# Patient Record
Sex: Female | Born: 1970
Health system: Southern US, Community
[De-identification: ages and names within clinical notes are randomized; demographics above are authoritative.]

## PROBLEM LIST (undated history)

## (undated) DIAGNOSIS — Z8489 Family history of other specified conditions: Secondary | ICD-10-CM

## (undated) DIAGNOSIS — R3129 Other microscopic hematuria: Secondary | ICD-10-CM

## (undated) DIAGNOSIS — N12 Tubulo-interstitial nephritis, not specified as acute or chronic: Secondary | ICD-10-CM

## (undated) DIAGNOSIS — R011 Cardiac murmur, unspecified: Secondary | ICD-10-CM

## (undated) DIAGNOSIS — N83209 Unspecified ovarian cyst, unspecified side: Secondary | ICD-10-CM

## (undated) DIAGNOSIS — T4145XA Adverse effect of unspecified anesthetic, initial encounter: Secondary | ICD-10-CM

## (undated) DIAGNOSIS — D649 Anemia, unspecified: Secondary | ICD-10-CM

## (undated) HISTORY — DX: Other microscopic hematuria: R31.29

## (undated) HISTORY — PX: WISDOM TOOTH EXTRACTION: SHX21

## (undated) HISTORY — PX: HERNIA REPAIR: SHX51

## (undated) HISTORY — DX: Unspecified ovarian cyst, unspecified side: N83.209

---

## 1996-01-14 HISTORY — PX: TUBAL LIGATION: SHX77

## 1997-11-26 ENCOUNTER — Emergency Department (HOSPITAL_COMMUNITY): Admission: EM | Admit: 1997-11-26 | Discharge: 1997-11-26 | Payer: Self-pay | Admitting: Emergency Medicine

## 1998-08-17 ENCOUNTER — Emergency Department (HOSPITAL_COMMUNITY): Admission: EM | Admit: 1998-08-17 | Discharge: 1998-08-17 | Payer: Self-pay | Admitting: Emergency Medicine

## 1998-09-07 ENCOUNTER — Other Ambulatory Visit: Admission: RE | Admit: 1998-09-07 | Discharge: 1998-09-07 | Payer: Self-pay | Admitting: *Deleted

## 1998-12-28 ENCOUNTER — Emergency Department (HOSPITAL_COMMUNITY): Admission: EM | Admit: 1998-12-28 | Discharge: 1998-12-28 | Payer: Self-pay | Admitting: Emergency Medicine

## 1999-04-28 ENCOUNTER — Encounter: Payer: Self-pay | Admitting: Emergency Medicine

## 1999-04-28 ENCOUNTER — Emergency Department (HOSPITAL_COMMUNITY): Admission: EM | Admit: 1999-04-28 | Discharge: 1999-04-28 | Payer: Self-pay | Admitting: Emergency Medicine

## 1999-07-25 ENCOUNTER — Encounter: Payer: Self-pay | Admitting: Orthopedic Surgery

## 1999-07-25 ENCOUNTER — Ambulatory Visit (HOSPITAL_COMMUNITY): Admission: RE | Admit: 1999-07-25 | Discharge: 1999-07-25 | Payer: Self-pay | Admitting: Orthopedic Surgery

## 2000-03-21 ENCOUNTER — Encounter: Admission: RE | Admit: 2000-03-21 | Discharge: 2000-06-19 | Payer: Self-pay | Admitting: Anesthesiology

## 2000-09-26 ENCOUNTER — Encounter: Admission: RE | Admit: 2000-09-26 | Discharge: 2000-12-25 | Payer: Self-pay | Admitting: Anesthesiology

## 2001-01-02 ENCOUNTER — Encounter: Payer: Self-pay | Admitting: Orthopedic Surgery

## 2001-01-02 ENCOUNTER — Encounter: Admission: RE | Admit: 2001-01-02 | Discharge: 2001-01-02 | Payer: Self-pay | Admitting: Orthopedic Surgery

## 2002-08-03 ENCOUNTER — Emergency Department (HOSPITAL_COMMUNITY): Admission: EM | Admit: 2002-08-03 | Discharge: 2002-08-03 | Payer: Self-pay | Admitting: Emergency Medicine

## 2002-08-03 ENCOUNTER — Encounter: Payer: Self-pay | Admitting: Emergency Medicine

## 2003-07-25 ENCOUNTER — Other Ambulatory Visit: Admission: RE | Admit: 2003-07-25 | Discharge: 2003-07-25 | Payer: Self-pay | Admitting: Obstetrics and Gynecology

## 2003-07-26 ENCOUNTER — Ambulatory Visit (HOSPITAL_COMMUNITY): Admission: RE | Admit: 2003-07-26 | Discharge: 2003-07-26 | Payer: Self-pay | Admitting: Obstetrics and Gynecology

## 2006-03-30 ENCOUNTER — Emergency Department (HOSPITAL_COMMUNITY): Admission: EM | Admit: 2006-03-30 | Discharge: 2006-03-30 | Payer: Self-pay | Admitting: Emergency Medicine

## 2006-12-28 ENCOUNTER — Inpatient Hospital Stay (HOSPITAL_COMMUNITY): Admission: AD | Admit: 2006-12-28 | Discharge: 2006-12-29 | Payer: Self-pay | Admitting: Family Medicine

## 2006-12-28 ENCOUNTER — Ambulatory Visit: Payer: Self-pay | Admitting: Family Medicine

## 2008-04-15 ENCOUNTER — Emergency Department: Payer: Self-pay | Admitting: Emergency Medicine

## 2008-10-08 ENCOUNTER — Emergency Department (HOSPITAL_COMMUNITY): Admission: EM | Admit: 2008-10-08 | Discharge: 2008-10-08 | Payer: Self-pay | Admitting: Emergency Medicine

## 2010-10-12 NOTE — Procedures (Signed)
[facility]  Patient:    Madison Adams, Madison Adams                        MRN: [id number] Proc. Date: 09/29/00 Adm. Date:  [id number] Attending:  Thyra Breed CC:         Harvie Junior, M.D.   Procedure Report  PROCEDURE:  Lumbar epidural steroid injection.  DIAGNOSIS:  Sciatica into the right lower extremity.  INTERVAL HISTORY:  The patient noted about a months worth of benefits from her last treatment with epidural steroid injections.  She got two shots at that time.  She since has noted insensification of her symptoms and pain even with sitting for brief periods of time with any pressure over her right buttock.  She has numbness that goes down into the right instep.  Associated with this, she has numbness and tingling up into the leg and pain.  It is affecting her sleep patterns as well as feeling very moody.  She wears a brace at work.  She has been on Ultram and methocarbamol, which she notes that the former seems to help her; the latter she in uncertain about.  PHYSICAL EXAMINATION:  The patient is afebrile with vital signs stable.  The patient noted increased pain with hyperextension of her back and palpation on the spinous processes of the lower lumbar spine.  Forward flexion did not increase her pain.  She had negative straight leg raise signs.  Deep tendon reflexes were symmetric at the knees and ankles.  Motor was difficult to test, as this seemed to increase her pain fairly markedly.  DESCRIPTION OF PROCEDURE:  After informed consent was obtained, the patient was placed in a sitting position and monitored.  Her back was prepped with Betadine x 3.  A skin wheal was raised at the L4-L5 interspace with 1% lidocaine.  A 20 gauge Tuohy needle was introduced to the lumbar epidural space to loss of resistance to preservative-free normal saline.  The depth was 4 cm.   There was no CSF nor blood.  Medrol 80 mg in 8 mL of preservative-free normal saline  was gently injected.  The needle was flushed with preservative-free normal saline and removed intact.  Postprocedure condition - stable.  DISCHARGE INSTRUCTIONS: 1. Resume previous diet. 2. Limitations on activities per instruction sheet. 3. Continue on current medications. 4. Follow up with me in one week for a repeat injection. DD:  09/29/00 TD:  09/27/00 Job: [id number]

## 2010-10-12 NOTE — Op Note (Signed)
[facility]  Patient:    Madison Adams, Madison Adams                        MRN: [id number] Proc. Date: 03/31/00 Adm. Date:  [id number] Attending:  Thyra Breed CC:         Harvie Junior, M.D.   Operative Report  PROCEDURE:  Lumbar epidural steroid injection.  DIAGNOSIS:  Low back pain with sciatic into the right lower extremity.  ANESTHESIOLOGIST:  Thyra Breed, M.D.  INTERVAL HISTORY:  The patient noted overall improvement after her first injection.  She developed a headache, but this responded to vigorous fluid intake.  PHYSICAL EXAMINATION:  VITAL SIGNS:  Blood pressure 124/81, heart rate 82, respiratory rate 12, O2 saturation 98%, plain level is 2/10, and temperature 98.7.  BACK:  Shows good healing from previous injection site.  HEENT:  She has no headache today.  NEUROLOGIC:  Grossly unchanged.  PROCEDURE:  After informed consent was obtained, the patient was placed in the sitting position and monitored.  Her back was prepped with Betadine x 3.  A skin wheal was raised at the L4-5 inner space with 1% lidocaine.  A 20-gauge Tuohy needle was introduced to the lumbar epidural space to loss of resistance to preservative free normal saline.  There was no CSF nor blood.  Then 80 mg of Medrol and 10 ml preservative free normal saline was gently injected.  The needle was flushed with preservative free normal saline and removed intact.  POSTPROCEDURE CONDITION:  Stable.  DISCHARGE INSTRUCTIONS: 1. Resume previous diet. 2. Limitation of activities per instruction sheet. 3. Continue on current medications. 4. Follow up in one week is she continues to have discomfort.  If she has a    good response to this injection, we will hold off any further injections. DD:  03/31/00 TD:  03/31/00 Job: [id number]
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
## 2010-10-12 NOTE — Procedures (Signed)
[facility]  Patient:    Madison Adams, Madison Adams                        MRN: [id number] Proc. Date: 10/13/00 Adm. Date:  [id number] Attending:  Thyra Breed CC:         Harvie Junior, M.D.   Procedure Report  PROCEDURE:  Lumbar epidural steroid injection.  DIAGNOSIS:  Sciatica into the right lower extremity.  INTERVAL HISTORY:  The patients noted some overall improvement after the injection and presents for her second injection today. She had minimal side effects from the first injection.  PHYSICAL EXAMINATION:  Blood pressure 121/70, heart rates 82, respiratory rate 18, O2 saturations 100%, pain level is 4/10. She demonstrates negative straight leg raise signs with a good healing of her back.  DESCRIPTION OF PROCEDURE:  After informed consent was obtained, the patient was placed in the sitting position and monitored. The patients back was prepped with Betadine x 3. A skin wheal was raised at the L4-5 interspace with 1 percent lidocaine. A 20 gauge Tuohy needle was introduced to the lumbar epidural space to loss of resistance to preservative free normal saline. There was no cerebrospinal fluid nor blood. The depth was 4 cm. 80 mg of Medrol and 8 ml of preservative free normal saline was gently injected. The needle was flushed with preservative free normal saline and removed intact.  CONDITION POST PROCEDURE:  Stable.  DISCHARGE INSTRUCTIONS:  Resume previous diet. Limitations in activities per instruction sheet. Continue on current medications. Follow-up with me in 1-2 weeks for repeat injection. DD:  10/13/00 TD:  10/13/00 Job: [id number]

## 2010-10-12 NOTE — Procedures (Signed)
[facility]  Patient:    Madison Adams, Madison Adams                        MRN: [id number] Proc. Date: 10/20/00 Adm. Date:  [id number] Attending:  Thyra Breed CC:         Harvie Junior, M.D.   Procedure Report  PROCEDURE:  Lumbar epidural steroid injection.  DIAGNOSIS:  Sciatica into the right lower extremity.  INTERVAL HISTORY:  The patient has noted marked improvement after her first two injections.  She is here for her third today.  She has had minimal side effects to the medications.  PHYSICAL EXAMINATION:  Blood pressure 112/67, heart rate 88, respiratory rate 18, O2 saturations 99%.  Pain level is 1/10.  Her back shows good healing from previous injection site.  DESCRIPTION OF PROCEDURE:  After informed consent was obtained, the patient was placed in a sitting position and monitored.  Her back was prepped with Betadine x 3.  A skin wheal was raised at the L4-L5 interspace with 1% lidocaine.  A 20 gauge Tuohy needle was introduced in the lumbar epidural space to loss of resistance to preservative-free normal saline.  There was no CSF nor blood.  The depth was 4 cm.  I injected 80 mg of Medrol in 8 mL of preservative-free normal saline.  The needle was flushed with preservative-free normal saline and removed intact.  Postprocedure condition - stable.  DISCHARGE INSTRUCTIONS: 1. Resume previous diet. 2. Limitations on activities per instruction sheet. 3. Continue on current medications. 4. The patient plans to follow up with Dr. Luiz Blare. DD:  10/20/00 TD:  10/20/00 Job: [id number]

## 2011-01-14 ENCOUNTER — Emergency Department: Payer: Self-pay | Admitting: Emergency Medicine

## 2011-03-11 LAB — HCG, SERUM, QUALITATIVE

## 2011-06-19 ENCOUNTER — Emergency Department: Payer: Self-pay | Admitting: Emergency Medicine

## 2011-06-19 LAB — URINALYSIS, COMPLETE
Bilirubin,UR: NEGATIVE
Glucose,UR: NEGATIVE mg/dL (ref 0–75)
Ketone: NEGATIVE
Nitrite: NEGATIVE
Specific Gravity: 1.001 (ref 1.003–1.030)
Squamous Epithelial: 1
WBC UR: <1 /HPF (ref 0–5)

## 2012-06-28 ENCOUNTER — Emergency Department: Payer: Self-pay | Admitting: Emergency Medicine

## 2013-05-27 DIAGNOSIS — T8859XA Other complications of anesthesia, initial encounter: Secondary | ICD-10-CM

## 2013-05-27 HISTORY — DX: Other complications of anesthesia, initial encounter: T88.59XA

## 2013-08-20 ENCOUNTER — Ambulatory Visit: Payer: Self-pay | Admitting: Nurse Practitioner

## 2013-08-23 ENCOUNTER — Encounter: Payer: Self-pay | Admitting: *Deleted

## 2013-09-09 ENCOUNTER — Ambulatory Visit (INDEPENDENT_AMBULATORY_CARE_PROVIDER_SITE_OTHER): Payer: 59 | Admitting: General Surgery

## 2013-09-09 ENCOUNTER — Encounter: Payer: Self-pay | Admitting: General Surgery

## 2013-09-09 ENCOUNTER — Other Ambulatory Visit: Payer: 59

## 2013-09-09 VITALS — BP 98/60 | HR 72 | Resp 12 | Ht 62.0 in | Wt 144.0 lb

## 2013-09-09 DIAGNOSIS — N63 Unspecified lump in unspecified breast: Secondary | ICD-10-CM

## 2013-09-09 DIAGNOSIS — N6019 Diffuse cystic mastopathy of unspecified breast: Secondary | ICD-10-CM

## 2013-09-09 DIAGNOSIS — N6011 Diffuse cystic mastopathy of right breast: Secondary | ICD-10-CM

## 2013-09-09 DIAGNOSIS — N6012 Diffuse cystic mastopathy of left breast: Secondary | ICD-10-CM

## 2013-09-09 NOTE — Patient Instructions (Signed)
Continue self breast exams. Call office for any new breast issues or concerns. 

## 2013-09-09 NOTE — Progress Notes (Signed)
Patient ID: Madison Adams, female   DOB: 1970-06-15, 43 y.o.   MRN: [id number]  Chief Complaint  Patient presents with  . Other    mammogram    HPI Madison Adams is a 43 y.o. female who presents for a breast evaluation. The most recent mammogram and ultrasound was done on 08-20-13.  Patient does perform regular self breast checks and this was her first mammogram done.  States the right breast is tender and she found a lump about 2 months ago. States it has not changed in size. There was no history of trauma prior to her appreciating the right breast mass. She makes uses very little in the way of caffeine drinks, but she does report in joint chocolate. No previous history of breast cysts.  The patient is presently employed at [employer].  HPI  No past medical history on file.  Past Surgical History  Procedure Laterality Date  . Tubal ligation  01/14/96    Family History  Problem Relation Age of Onset  . Breast cancer Mother 20    Social History History  Substance Use Topics  . Smoking status: Current Every Day Smoker -- 1.00 packs/day for 11 years    Types: Cigarettes  . Smokeless tobacco: Never Used  . Alcohol Use: Yes    Allergies  Allergen Reactions  . Penicillins Rash    Current Outpatient Prescriptions  Medication Sig Dispense Refill  . Aspirin-Caffeine (BC FAST PAIN RELIEF PO) Take 1 Package by mouth as needed.       No current facility-administered medications for this visit.    Review of Systems Review of Systems  Constitutional: Negative.   Respiratory: Negative.   Cardiovascular: Negative.     Blood pressure 98/60, pulse 72, resp. rate 12, height 5\' 2"  (1.575 m), weight 144 lb (65.318 kg), last menstrual period 08/28/2013.  Physical Exam Physical Exam  Constitutional: She is oriented to person, place, and time. She appears well-developed and well-nourished.  Neck: Neck supple.  Cardiovascular: Normal rate,  regular rhythm and normal heart sounds.   Pulmonary/Chest: Effort normal and breath sounds normal. Right breast exhibits mass and tenderness (Diffuse tenderness throughout the right breast, especially in the retroareolar N. upper-outer quadrant.). Right breast exhibits no inverted nipple, no nipple discharge and no skin change. Left breast exhibits mass. Left breast exhibits no inverted nipple, no nipple discharge, no skin change and no tenderness.    1.5 cm nodule left 2 o'clock. 5 cm mass 10 o'clock right breast center around areolar.  Abdominal: Normal appearance. A hernia is present.  Lymphadenopathy:    She has no cervical adenopathy.    She has no axillary adenopathy.  Neurological: She is alert and oriented to person, place, and time.  Skin: Skin is warm and dry.    Data Reviewed Notes from Markham Jordan, nurse practitioner were reviewed.  Bilateral mammograms and ultrasounds were completed on August 20, 2013. Mammogram showed a 4 cm mass in the right breast. 3 masses were identified in the left breast.  Right breast ultrasound showed a dominant 4 cm mass at the 10 o'clock position.. Left breast ultrasound showed a simple cyst.  Ultrasound examination of the right breast was completed confirming a large simple cyst at the 10 o'clock position 4 cm from the nipple measuring 2.6 x 3.2 x 4.1 cm. This was aspirated returning 30 cc of slightly turbid fluid with complete resolution. Air was instilled in the cavity post aspiration.  At the  11 o'clock position just superior to the dominant mass was a hypoechoic lesion with faint acoustic enhancement measuring 0.5 x 1.0 x 1.2 cm. This area was distinctly different from all the cystic lesions in the right breast and underwent FNA sampling with 4 slides prepared. At the 12 o'clock position of the right breast 3 cm from the nipple a 1.1 x 2.5 x 2.9 cm simple cyst is identified.  Assessment    Symptomatic right breast cyst, resolved on aspiration.  Indeterminate nodule of o'clock, likely desiccated cyst.     Plan    The patient will be contacted when the cytology results are available. We'll plan on a followup examination in 6 weeks, earlier if she appreciate cyst recurrence.     PCP: Urgent Care Ref: Guinevere Ferrari NP  Forest Gleason Byrnett 09/09/2013, 9:06 PM

## 2013-09-10 LAB — FINE-NEEDLE ASPIRATION

## 2013-09-13 ENCOUNTER — Telehealth: Payer: Self-pay | Admitting: General Surgery

## 2013-09-13 NOTE — Telephone Encounter (Signed)
The patient was notified of the cytology results from the right breast lesion obtained on April 16. This showed a very acellular specimen but with a few atypical ductal cells. I spoke with the pathologist about this, and while it could be simple degenerative changes, she can't be sure the more significant processes present.  Considering that this was a perfectly Spira pole area, and with her multiple other cysts, I think is more likely that this is degenerated atypia rather than malignant degeneration.  The patient is presently scheduled for followup exam in just under 6 weeks. We can reassess at that time, she is anxious I can do a vacuum biopsy earlier.  At the present, the patient is comfortable to follow up exam presently scheduled.  Will repeat her ultrasound at that time, and if the area is persistent we'll complete a vacuum biopsy at that visit.  The patient reports that the right breast tenderness is markedly improved since the 4 cm cyst was aspirated. She does still have a little residual discomfort, nothing like she was experiencing prior to aspiration.

## 2013-10-20 ENCOUNTER — Ambulatory Visit: Payer: 59 | Admitting: General Surgery

## 2013-10-28 ENCOUNTER — Ambulatory Visit (INDEPENDENT_AMBULATORY_CARE_PROVIDER_SITE_OTHER): Payer: 59 | Admitting: General Surgery

## 2013-10-28 ENCOUNTER — Other Ambulatory Visit: Payer: 59

## 2013-10-28 ENCOUNTER — Encounter: Payer: Self-pay | Admitting: General Surgery

## 2013-10-28 VITALS — BP 130/74 | HR 76 | Resp 12 | Ht 62.0 in | Wt 147.0 lb

## 2013-10-28 DIAGNOSIS — N63 Unspecified lump in unspecified breast: Secondary | ICD-10-CM

## 2013-10-28 HISTORY — PX: BREAST BIOPSY: SHX20

## 2013-10-28 NOTE — Patient Instructions (Signed)

## 2013-10-28 NOTE — Progress Notes (Signed)
Patient ID: Madison Adams, female   DOB: 08/03/1970, 43 y.o.   MRN: [id number]  Chief Complaint  Patient presents with  . Follow-up    breast cyst    HPI KYLANI WIRES is a 43 y.o. female here today following up from right breast cysts. Patient states she dose not feel any new lumps.  HPI  No past medical history on file.  Past Surgical History  Procedure Laterality Date  . Tubal ligation  01/14/96    Family History  Problem Relation Age of Onset  . Breast cancer Mother 65    Social History History  Substance Use Topics  . Smoking status: Current Every Day Smoker -- 1.00 packs/day for 11 years    Types: Cigarettes  . Smokeless tobacco: Never Used  . Alcohol Use: Yes    Allergies  Allergen Reactions  . Penicillins Rash    Current Outpatient Prescriptions  Medication Sig Dispense Refill  . Aspirin-Caffeine (BC FAST PAIN RELIEF PO) Take 1 Package by mouth as needed.      . Multiple Vitamins-Minerals (MULTIVITAMIN WITH MINERALS) tablet Take 1 tablet by mouth daily.       No current facility-administered medications for this visit.    Review of Systems Review of Systems  Constitutional: Negative.   Respiratory: Negative.   Cardiovascular: Negative.     Blood pressure 130/74, pulse 76, resp. rate 12, height 5\' 2"  (1.575 m), weight 147 lb (66.679 kg).  Physical Exam Physical Exam  Constitutional: She is oriented to person, place, and time. She appears well-developed and well-nourished.  Eyes: Conjunctivae are normal. No scleral icterus.  Neck: Neck supple.  Cardiovascular: Normal rate, regular rhythm and normal heart sounds.   Pulmonary/Chest: Breath sounds normal. Right breast exhibits tenderness ( right lower breast). Right breast exhibits no inverted nipple, no nipple discharge and no skin change. Left breast exhibits no inverted nipple, no nipple discharge, no skin change and no tenderness. Mass:  left breast 2 o'clock 2 cm nodular.  Lymphadenopathy:   She has no cervical adenopathy.    She has no axillary adenopathy.  Neurological: She is alert and oriented to person, place, and time.  Skin: Skin is warm and dry.    Data Reviewed September 09, 2013 right breast 11:00 FNA  Comments: RIGHT BREAST INCONCLUSIVE. PAUCICELLULAR SPECIMEN WITH RARE ATYPICAL DUCTAL CELLS. CLINICAL AND RADIOLOGIC CORRELATION IS RECOMMENDED. FURTHER TISSUE SAMPLING, SUCH AS CORE BIOPSY, IS RECOMMENDED IF CLINICALLY WARRANTED. Signed out by:  Comment   Comments: Mathis Dad, MD, Pathologist    Ultrasound examination of the right breast showed that the previously aspirated cyst at the 12 o'clock position partially reformed. This now measures 0.7 x 2.5 x 3.3 cm. It is neither palpable nor tender. At the 10 o'clock position several small cysts measuring less than 8 mm in diameter were identified. At the 10 o'clock position 2 small less than 4 mm cyst were identified. At the 11 o'clock position the hypoechoic area for which cytology was obtained in April now measures 0.78 x 0.99 x 1.04 cm. Faint acoustic enhancement is noted. Borders are smooth. This is again felt to represent a desiccating cyst. Cytology was however notable for atypical cells. BI-RAD-3  The patient was amenable to vacuum biopsy of the area. 10 cc of 0.5% Xylocaine with 0.25% Marcaine with 1-200,000 units of epinephrine was utilized well-tolerated. A 10-gauge Encor device was passed through lesion under ultrasound guidance in approximately 10 core samples obtained. Scant bleeding was noted. A postbiopsy  clip was placed. The skin defect was closed with benzoin and Steri-Strips followed by Telfa and Tegaderm dressing.  The palpable mass in the upper or quadrant of the left breast was found to represent a bi-lobe cyst measuring 1.2 x 1.8 x 2.2 cm. The patient was anxious about this lesion and we proceeded to aspiration which was well tolerated. A proximally 90+ percent resolution was noted. Clear fluid was  obtained and discarded. BI-RAD-2   Assessment    Bilateral breast cysts.     Plan    The patient will be contacted when the biopsy results are available. She is aware this will likely be on November 01, 2013. Post biopsy instructions were provided. Followup will be determined based on final pathology results.     Ref: Guinevere Ferrari NP       Forest Gleason Teairra Millar 10/29/2013, 10:00 PM

## 2013-10-29 DIAGNOSIS — N63 Unspecified lump in unspecified breast: Secondary | ICD-10-CM | POA: Insufficient documentation

## 2013-11-01 ENCOUNTER — Telehealth: Payer: Self-pay | Admitting: General Surgery

## 2013-11-01 LAB — PATHOLOGY

## 2013-11-01 NOTE — Telephone Encounter (Signed)
Notified path benign. She will f/u later this week w/ staff.  Will arrange for MD exam in one month to discuss finding of 1 mm area of ADH.

## 2013-11-02 ENCOUNTER — Telehealth: Payer: Self-pay | Admitting: General Surgery

## 2013-11-02 NOTE — Telephone Encounter (Signed)
PATIENT HAS AN APPT 11-04-13 WITH THE NURSE FOR A F/U AFTER A BR BX. DR BYRNETT WOULD LIKE HER TO RETURN TO SEE HIM IN 4-6 WKS  WHAT IS CONVENIENT WITH THE PATIENT/MTH

## 2013-11-04 ENCOUNTER — Ambulatory Visit (INDEPENDENT_AMBULATORY_CARE_PROVIDER_SITE_OTHER): Payer: 59 | Admitting: *Deleted

## 2013-11-04 DIAGNOSIS — N63 Unspecified lump in unspecified breast: Secondary | ICD-10-CM

## 2013-11-04 NOTE — Patient Instructions (Signed)
Continue self breast exams. Call office for any new breast issues or concerns. 

## 2013-11-04 NOTE — Progress Notes (Signed)
Patient here today for follow up post right breast biopsy. No dressing or steri strip..  Minimal bruising noted.  The patient is aware that a heating pad may be used for comfort as needed.  Aware of pathology. Follow up as scheduled.

## 2013-12-14 ENCOUNTER — Encounter: Payer: Self-pay | Admitting: General Surgery

## 2013-12-14 ENCOUNTER — Ambulatory Visit (INDEPENDENT_AMBULATORY_CARE_PROVIDER_SITE_OTHER): Payer: 59 | Admitting: General Surgery

## 2013-12-14 VITALS — BP 120/70 | HR 70 | Resp 12 | Ht 62.0 in | Wt 144.0 lb

## 2013-12-14 DIAGNOSIS — N6089 Other benign mammary dysplasias of unspecified breast: Secondary | ICD-10-CM

## 2013-12-14 DIAGNOSIS — N63 Unspecified lump in unspecified breast: Secondary | ICD-10-CM

## 2013-12-14 DIAGNOSIS — N6091 Unspecified benign mammary dysplasia of right breast: Secondary | ICD-10-CM

## 2013-12-14 NOTE — Progress Notes (Signed)
Patient ID: Madison Adams, female   DOB: Apr 18, 1971, 43 y.o.   MRN: [id number]  Chief Complaint  Patient presents with  . Follow-up    right breast biopsy    HPI Madison Adams is a 43 y.o. female.  here today for follow up right breast biopsy that done 10-28-13. No new breast issues.  HPI  No past medical history on file.  Past Surgical History  Procedure Laterality Date  . Tubal ligation  01/14/96  . Breast biopsy Right 10-28-13    FOCUS OF ATYPICAL DUCTAL HYPERPLASIA, 1 MM.    Family History  Problem Relation Age of Onset  . Breast cancer Mother 73    Social History History  Substance Use Topics  . Smoking status: Current Every Day Smoker -- 1.00 packs/day for 11 years    Types: Cigarettes  . Smokeless tobacco: Never Used  . Alcohol Use: Yes    Allergies  Allergen Reactions  . Penicillins Rash    Current Outpatient Prescriptions  Medication Sig Dispense Refill  . Aspirin-Caffeine (BC FAST PAIN RELIEF PO) Take 1 Package by mouth as needed.      . Multiple Vitamins-Minerals (MULTIVITAMIN WITH MINERALS) tablet Take 1 tablet by mouth daily.       No current facility-administered medications for this visit.    Review of Systems Review of Systems  Blood pressure 120/70, pulse 70, resp. rate 12, height 5\' 2"  (1.575 m), weight 144 lb (65.318 kg), last menstrual period 12/03/2013.  Physical Exam Physical Exam  Constitutional: She is oriented to person, place, and time. She appears well-developed and well-nourished.  Eyes: Conjunctivae are normal. No scleral icterus.  Neck: Neck supple.  Cardiovascular: Normal rate, regular rhythm and normal heart sounds.   Pulmonary/Chest: Effort normal. Right breast exhibits tenderness. Right breast exhibits no inverted nipple, no mass, no nipple discharge and no skin change. Left breast exhibits tenderness. Left breast exhibits no inverted nipple, no mass, no nipple discharge and no skin change.  Outer quadrant bilateral breast  tendernes  Lymphadenopathy:    She has no cervical adenopathy.    She has no axillary adenopathy.  Neurological: She is alert and oriented to person, place, and time.  Skin: Skin is warm and dry.    Data Reviewed Pathology reviewed: 1 mm foci of 80 H.  Assessment    Atypical ductal hyperplasia of the right breast. Family history of breast cancer in her mother.    Plan    Options for management were reviewed: 1) observation with a tiny foci and a large volume of tissue as well as antiestrogen therapy versus 2) formal excision followed by antiestrogen therapy versus 3) observation alone. The patient's very anxious not to miss any possibility for upstaging of disease and for that reason elects to proceed to excision of the adjacent area. This will be completed with wire localization. The procedure was reviewed. Risks including those of bleeding and infection discussed.    Patient is scheduled for surgery at [facility] on 01/07/14. She will pre admit by phone on 12/29/13. She will arrive at the [facility] on 01/07/14 at 8:45 am. Patient is aware of date and instructions.   PCP: No Pcp Per Patient   Robert Bellow 12/15/2013, 12:28 PM

## 2013-12-14 NOTE — Patient Instructions (Addendum)
Follow up appointment to be announced.  Patient is scheduled for surgery at [facility] on 01/07/14. She will pre admit by phone on 12/29/13. She will arrive at the [facility] on 01/07/14 at 8:45 am. Patient is aware of date and instructions.

## 2013-12-15 ENCOUNTER — Telehealth: Payer: Self-pay

## 2013-12-15 ENCOUNTER — Other Ambulatory Visit: Payer: Self-pay | Admitting: General Surgery

## 2013-12-15 DIAGNOSIS — N6091 Unspecified benign mammary dysplasia of right breast: Secondary | ICD-10-CM | POA: Insufficient documentation

## 2013-12-15 NOTE — Telephone Encounter (Signed)
Called and notified patient that she would need to report to the [facility] on 01/07/14 at 8:45 am for her needle wire loc and they would get her to surgery afterwards. Patient is aware to call back for any questions.

## 2014-01-07 ENCOUNTER — Ambulatory Visit: Payer: Self-pay | Admitting: General Surgery

## 2014-01-07 DIAGNOSIS — D4860 Neoplasm of uncertain behavior of unspecified breast: Secondary | ICD-10-CM

## 2014-01-07 DIAGNOSIS — N6089 Other benign mammary dysplasias of unspecified breast: Secondary | ICD-10-CM

## 2014-01-07 HISTORY — PX: BREAST SURGERY: SHX581

## 2014-01-11 ENCOUNTER — Encounter: Payer: Self-pay | Admitting: General Surgery

## 2014-01-11 LAB — PATHOLOGY REPORT

## 2014-01-12 ENCOUNTER — Encounter: Payer: Self-pay | Admitting: General Surgery

## 2014-01-12 ENCOUNTER — Telehealth: Payer: Self-pay | Admitting: General Surgery

## 2014-01-12 NOTE — Telephone Encounter (Signed)
Notified no additional areas of atypical ductal hyperplasia identified. She reports minimal pain.  We will review options for further management at the time of her followup tomorrow.

## 2014-01-13 ENCOUNTER — Ambulatory Visit (INDEPENDENT_AMBULATORY_CARE_PROVIDER_SITE_OTHER): Payer: Self-pay | Admitting: General Surgery

## 2014-01-13 ENCOUNTER — Encounter: Payer: Self-pay | Admitting: General Surgery

## 2014-01-13 VITALS — BP 120/80 | HR 78 | Resp 12 | Ht 62.0 in | Wt 140.0 lb

## 2014-01-13 DIAGNOSIS — N6091 Unspecified benign mammary dysplasia of right breast: Secondary | ICD-10-CM

## 2014-01-13 DIAGNOSIS — N6089 Other benign mammary dysplasias of unspecified breast: Secondary | ICD-10-CM

## 2014-01-13 MED ORDER — RALOXIFENE HCL 60 MG PO TABS: 60.0000 mg | ORAL_TABLET | Freq: Every day | ORAL | Status: DC

## 2014-01-13 NOTE — Progress Notes (Signed)
Patient ID: Madison Adams, female   DOB: 08-26-1970, 43 y.o.   MRN: [id number]  Chief Complaint  Patient presents with  . Routine Post Op    wide excision right breast 01-07-14    HPI Madison Adams is a 43 y.o. female.  Here today for her postoperative visit, right wide excision right breast 01-07-14. States she is doing well. Pain controlled with Aleve.  HPI  No past medical history on file.  Past Surgical History  Procedure Laterality Date  . Tubal ligation  01/14/96  . Breast biopsy Right 10-28-13    FOCUS OF ATYPICAL DUCTAL HYPERPLASIA, 1 MM.  Marland Kitchen Breast surgery Right 01-07-14    1 mm foci of atypical ductal hyperplasia, Gail model risk 3.8%, 5 years; 34.5% lifetime.    Family History  Problem Relation Age of Onset  . Breast cancer Mother 70    Social History History  Substance Use Topics  . Smoking status: Current Every Day Smoker -- 1.00 packs/day for 11 years    Types: Cigarettes  . Smokeless tobacco: Never Used  . Alcohol Use: Yes    Allergies  Allergen Reactions  . Penicillins Rash    Current Outpatient Prescriptions  Medication Sig Dispense Refill  . Multiple Vitamins-Minerals (MULTIVITAMIN WITH MINERALS) tablet Take 1 tablet by mouth daily.      . naproxen sodium (ANAPROX) 220 MG tablet Take 220 mg by mouth as needed.      . raloxifene (EVISTA) 60 MG tablet Take 1 tablet (60 mg total) by mouth daily.  30 tablet  11   No current facility-administered medications for this visit.    Review of Systems Review of Systems  Constitutional: Negative.   Respiratory: Negative.   Cardiovascular: Negative.     Blood pressure 120/80, pulse 78, resp. rate 12, height 5\' 2"  (1.575 m), weight 140 lb (63.504 kg), last menstrual period 12/28/2013.  Physical Exam Physical Exam  Constitutional: She is oriented to person, place, and time. She appears well-developed and well-nourished.  Pulmonary/Chest:  Steri strips intact right breast, incision healing well   Neurological: She is alert and oriented to person, place, and time.  Skin: Skin is warm and dry.    Data Reviewed No additional atypical ductal hyperplasia was noted on the wide excision sample. Original sample showed 1 mm of 88.  Assessment    Doing well status post wide excision of the right breast.     Plan    Indications for chemotherapy prevention were reviewed. The patient is at increased risk for future malignancy. The potential for a 50% reduction (absolute 2% in the next 5 years) with antiestrogen therapy was reviewed. The patient is a smoker, and relates that it is unlikely she will stop smoking. For this reason Evista 60 mg daily is felt to be a better chemopreventive than tamoxifen.  Arrangements were made for a followup examination in one month. The patient was encouraged to make use of the Evista for one month regardless of symptoms.      PCP: none Ref: Guinevere Ferrari NP   Robert Bellow 01/14/2014, 6:30 PM

## 2014-01-13 NOTE — Patient Instructions (Signed)
Continue self breast exams. Call office for any new breast issues or concerns. 

## 2014-01-14 ENCOUNTER — Encounter: Payer: Self-pay | Admitting: General Surgery

## 2014-02-14 ENCOUNTER — Encounter: Payer: Self-pay | Admitting: General Surgery

## 2014-02-14 ENCOUNTER — Ambulatory Visit (INDEPENDENT_AMBULATORY_CARE_PROVIDER_SITE_OTHER): Payer: Self-pay | Admitting: General Surgery

## 2014-02-14 VITALS — BP 110/78 | HR 76 | Resp 12 | Ht 62.0 in | Wt 145.0 lb

## 2014-02-14 DIAGNOSIS — N6089 Other benign mammary dysplasias of unspecified breast: Secondary | ICD-10-CM

## 2014-02-14 DIAGNOSIS — N6091 Unspecified benign mammary dysplasia of right breast: Secondary | ICD-10-CM

## 2014-02-14 NOTE — Progress Notes (Signed)
Patient ID: Madison Adams, female   DOB: Jul 26, 1970, 43 y.o.   MRN: [id number]  Chief Complaint  Patient presents with  . Follow-up    right breast biopsy     HPI Madison Adams is a 43 y.o. female here today following up from a right breast biopsy done on 01/07/14. The patient is doing well.   HPI  History reviewed. No pertinent past medical history.  Past Surgical History  Procedure Laterality Date  . Tubal ligation  01/14/96  . Breast biopsy Right 10-28-13    FOCUS OF ATYPICAL DUCTAL HYPERPLASIA, 1 MM.  Marland Kitchen Breast surgery Right 01-07-14    1 mm foci of atypical ductal hyperplasia, Gail model risk 3.8%, 5 years; 34.5% lifetime.    Family History  Problem Relation Age of Onset  . Breast cancer Mother 31    Social History History  Substance Use Topics  . Smoking status: Current Every Day Smoker -- 1.00 packs/day for 11 years    Types: Cigarettes  . Smokeless tobacco: Never Used  . Alcohol Use: Yes    Allergies  Allergen Reactions  . Penicillins Rash    Current Outpatient Prescriptions  Medication Sig Dispense Refill  . cephALEXin (KEFLEX) 500 MG capsule Take 500 mg by mouth 4 (four) times daily.      . Multiple Vitamins-Minerals (MULTIVITAMIN WITH MINERALS) tablet Take 1 tablet by mouth daily.      . naproxen sodium (ANAPROX) 220 MG tablet Take 220 mg by mouth as needed.      . raloxifene (EVISTA) 60 MG tablet Take 1 tablet (60 mg total) by mouth daily.  30 tablet  11   No current facility-administered medications for this visit.    Review of Systems Review of Systems  Constitutional: Negative.   Respiratory: Negative.   Cardiovascular: Negative.     Blood pressure 110/78, pulse 76, resp. rate 12, height 5\' 2"  (1.575 m), weight 145 lb (65.772 kg), last menstrual period 01/22/2014.  Physical Exam Physical Exam  Constitutional: She is oriented to person, place, and time. She appears well-developed and well-nourished.  Pulmonary/Chest:  Right breast  incision site healing well.   Neurological: She is alert and oriented to person, place, and time.  Skin: Skin is warm and dry.    Data Reviewed No new data.  Assessment    Improving tolerance of anti-antigen therapy. Chemoprevention for ADH.     Plan    We'll continue her present medication, Evista 60 mg daily. Smoking cessation was again encouraged. We'll plan for follow up examination with bilateral diagnostic mammograms in March 2016.     PCP; No PCP Ref MD: Guinevere Ferrari   Robert Bellow 02/14/2014, 9:11 PM

## 2014-03-28 ENCOUNTER — Encounter: Payer: Self-pay | Admitting: General Surgery

## 2014-08-10 ENCOUNTER — Encounter: Payer: Self-pay | Admitting: General Surgery

## 2014-08-16 ENCOUNTER — Encounter: Payer: Self-pay | Admitting: General Surgery

## 2014-08-16 ENCOUNTER — Ambulatory Visit (INDEPENDENT_AMBULATORY_CARE_PROVIDER_SITE_OTHER): Payer: 59 | Admitting: General Surgery

## 2014-08-16 VITALS — BP 128/78 | HR 80 | Resp 12 | Ht 62.0 in | Wt 147.0 lb

## 2014-08-16 DIAGNOSIS — N62 Hypertrophy of breast: Secondary | ICD-10-CM

## 2014-08-16 DIAGNOSIS — N6091 Unspecified benign mammary dysplasia of right breast: Secondary | ICD-10-CM

## 2014-08-16 NOTE — Patient Instructions (Addendum)
The patient has been asked to return to the office one year with a bilateral screening mammogram.

## 2014-08-16 NOTE — Progress Notes (Signed)
Patient ID: Madison Adams, female   DOB: 03/20/1971, 44 y.o.   MRN: [id number]  Chief Complaint  Patient presents with  . Follow-up    mammogram    HPI Madison Adams is a 44 y.o. female who presents for a breast evaluation. The most recent mammogram was done on 08/09/14. Patient does perform regular self breast checks and gets regular mammograms done.   The patient continues to tolerate ongoing Evista therapy well. This was chosen due to her ongoing smoking to minimize the risk of DVT. HPI  No past medical history on file.  Past Surgical History  Procedure Laterality Date  . Tubal ligation  01/14/96  . Breast biopsy Right 10-28-13    FOCUS OF ATYPICAL DUCTAL HYPERPLASIA, 1 MM.  Madison Adams Breast surgery Right 01-07-14    1 mm foci of atypical ductal hyperplasia, Gail model risk 3.8%, 5 years; 34.5% lifetime.    Family History  Problem Relation Age of Onset  . Breast cancer Mother 65    Social History History  Substance Use Topics  . Smoking status: Current Every Day Smoker -- 1.00 packs/day for 11 years    Types: Cigarettes  . Smokeless tobacco: Never Used  . Alcohol Use: Yes    Allergies  Allergen Reactions  . Penicillins Rash    Current Outpatient Prescriptions  Medication Sig Dispense Refill  . raloxifene (EVISTA) 60 MG tablet Take 1 tablet (60 mg total) by mouth daily. 30 tablet 11   No current facility-administered medications for this visit.    Review of Systems Review of Systems  Constitutional: Negative.   Respiratory: Negative.   Cardiovascular: Negative.     Blood pressure 128/78, pulse 80, resp. rate 12, height 5\' 2"  (1.575 m), weight 147 lb (66.679 kg).  Physical Exam Physical Exam  Constitutional: She is oriented to person, place, and time.  Eyes: Conjunctivae are normal. No scleral icterus.  Neck: Neck supple.  Cardiovascular: Normal rate, regular rhythm and normal heart sounds.   Pulmonary/Chest: Effort normal and breath sounds normal. Right  breast exhibits no inverted nipple, no mass, no nipple discharge, no skin change and no tenderness. Left breast exhibits no inverted nipple, no mass, no nipple discharge, no skin change and no tenderness.    Little volume lost right upper outer just above the areolar right breast. Well healed incision upper outer quadrant .  Abdominal: Soft. Bowel sounds are normal. There is no tenderness.  Lymphadenopathy:    She has no cervical adenopathy.    She has no axillary adenopathy.  Neurological: She is alert and oriented to person, place, and time.  Skin: Skin is warm and dry.    Data Reviewed Bilateral mammograms dated 08/09/2014 completed at [facility] showed no interval change. I BI-RADS-2.  Assessment    Benign breast exam.    Plan    The patient has been asked to return to the office in one year with a bilateral screening mammogram.     PCP; No PCP Ref MD: Guinevere Ferrari   Robert Bellow 08/16/2014, 8:49 PM

## 2014-09-17 NOTE — Op Note (Signed)
PATIENT NAME:  Madison Adams, Madison Adams MR#:  [id number] DATE OF BIRTH:  27-Jan-1971  DATE OF PROCEDURE:  01/07/2014  PREOPERATIVE DIAGNOSIS: Right breast atypical ductal hyperplasia.  POSTOPERATIVE DIAGNOSIS: Right breast atypical ductal hyperplasia.     OPERATIVE PROCEDURE: Wide excision of right breast with needle localization.   SURGEON: Robert Bellow, MD   ANESTHESIA: General by LMA under Amy M. Rice, MD; Marcaine 0.5% with 1: 200,000 units epinephrine, 30 mL local infiltration.   ESTIMATED BLOOD LOSS: Minimal.   CLINICAL NOTE: This 44 year old  woman recently underwent a biopsy showing evidence of a 1 mm foci of ADH. With a family history of breast cancer in a first degree relative,  she elected to proceed to wide excision to rule out a more significant process. She had undergone needle localization, completed by Margarette Canada, MD, in radiology prior to the procedure.   OPERATIVE NOTE: With the patient under adequate general anesthesia, the breast was prepped with ChloraPrep and draped. Ultrasound was used to identify the anticipated course of the needle, based on review of the post needle localization images. A curvilinear incision in the upper outer quadrant from the 10 to 1 o'clock position was made, after instillation of local anesthesia. The skin was incised sharply and the remaining dissection completed with electrocautery. The adipose layer was elevated off the underlying breast parenchyma, and a 3 x 3 x 3 cm block of tissue was excised, orientated, and sent for specimen radiography. This confirm the removal of the previously placed biopsy clip in the center of the specimen, as well as the entire needle tip.   The breast parenchyma was then elevated off the underlying fascia. This was approximated with interrupted 2-0 Vicryl figure-of-eight sutures. Flaps were raised circumferentially for about 4 cm in distance. The adipose layer was approximated with interrupted 2-0 Vicryl figure-of-eight  suture. The skin was closed with a running 4-0 Vicryl subcuticular suture. Benzoin and Steri-Strips, followed by a Telfa pad, were applied. Fluff gauze, Kerlix, and an Ace wrap were then placed. The patient was taken to the recovery room in stable condition.    ____________________________ Robert Bellow, MD jwb:MT D: 01/07/2014 20:05:40 ET T: 01/08/2014 05:11:42 ET JOB#: [id number]  cc: Robert Bellow, MD, <Dictator> Guinevere Ferrari, NP - [facility] Mordecai Tindol Amedeo Kinsman MD ELECTRONICALLY SIGNED 01/08/2014 22:58

## 2014-10-20 ENCOUNTER — Other Ambulatory Visit: Payer: Self-pay | Admitting: Urology

## 2014-10-20 DIAGNOSIS — R319 Hematuria, unspecified: Secondary | ICD-10-CM

## 2014-10-28 ENCOUNTER — Ambulatory Visit
Admission: RE | Admit: 2014-10-28 | Discharge: 2014-10-28 | Disposition: A | Discharge status: Home / Self Care | Payer: 59 | Source: Ambulatory Visit | Attending: Urology | Admitting: Urology

## 2014-10-28 DIAGNOSIS — R319 Hematuria, unspecified: Secondary | ICD-10-CM | POA: Diagnosis not present

## 2014-10-31 ENCOUNTER — Other Ambulatory Visit: Payer: Self-pay

## 2014-10-31 DIAGNOSIS — R3129 Other microscopic hematuria: Secondary | ICD-10-CM

## 2014-11-14 ENCOUNTER — Ambulatory Visit (INDEPENDENT_AMBULATORY_CARE_PROVIDER_SITE_OTHER): Payer: 59 | Admitting: Urology

## 2014-11-14 ENCOUNTER — Encounter: Payer: Self-pay | Admitting: Urology

## 2014-11-14 VITALS — BP 101/71 | HR 98 | Resp 18 | Ht 62.0 in | Wt 142.4 lb

## 2014-11-14 DIAGNOSIS — R312 Other microscopic hematuria: Secondary | ICD-10-CM | POA: Diagnosis not present

## 2014-11-14 DIAGNOSIS — N390 Urinary tract infection, site not specified: Secondary | ICD-10-CM

## 2014-11-14 DIAGNOSIS — R3129 Other microscopic hematuria: Secondary | ICD-10-CM | POA: Insufficient documentation

## 2014-11-14 DIAGNOSIS — F17210 Nicotine dependence, cigarettes, uncomplicated: Secondary | ICD-10-CM

## 2014-11-14 DIAGNOSIS — F17200 Nicotine dependence, unspecified, uncomplicated: Secondary | ICD-10-CM | POA: Insufficient documentation

## 2014-11-14 LAB — MICROSCOPIC EXAMINATION

## 2014-11-14 LAB — URINALYSIS, COMPLETE
BILIRUBIN UA: NEGATIVE
GLUCOSE, UA: NEGATIVE
KETONES UA: NEGATIVE
LEUKOCYTES UA: NEGATIVE
NITRITE UA: NEGATIVE
Protein, UA: NEGATIVE
SPEC GRAV UA: 1.01 (ref 1.005–1.030)
UUROB: 0.2 mg/dL (ref 0.2–1.0)
pH, UA: 5.5 (ref 5.0–7.5)

## 2014-11-14 NOTE — Progress Notes (Signed)
11/14/2014 3:28 PM   Madison Adams [birth year] [id number]  Referring provider: No referring provider defined for this encounter.  Chief Complaint  Patient presents with  . Results    Renal US    HPI: Mrs. Cowles is a 44 year old white female who underwent renal ultrasound for further investigation of her recurrent episodes of pyelonephritis. She has had one episode of pyelonephritis each year for the last 2 years. She does not believe that these infections are brought on by sexual intercourse or had correlation with her menstrual cycle.  She is also wiping front to back, does not take tub baths, but she does suffer with constipation.  She currently does not have fevers, chills, nausea, vomiting, flank pain, suprapubic pain, dysuria or gross hematuria. She did have microscopic hematuria on her UA today with 3-10 RBCs seen per high-power field. She does admit that she has completed her menstrual cycle a few days ago.  She also had microscopic hematuria at her visit with [place] in 4 weeks ago. Urine culture was negative at that time.  Her renal ultrasound was negative for stones and hydronephrosis. She does not need to take the tamsulosin.     PMH: Past Medical History  Diagnosis Date  . Kidney infection   . Hematuria, microscopic     Surgical History: Past Surgical History  Procedure Laterality Date  . Tubal ligation  01/14/96  . Breast biopsy Right 10-28-13    FOCUS OF ATYPICAL DUCTAL HYPERPLASIA, 1 MM.  Marland Kitchen Breast surgery Right 01-07-14    1 mm foci of atypical ductal hyperplasia, Gail model risk 3.8%, 5 years; 34.5% lifetime.    Home Medications:    Medication List       This list is accurate as of: 11/14/14  3:28 PM.  Always use your most recent med list.               raloxifene 60 MG tablet  Commonly known as:  EVISTA  Take 1 tablet (60 mg total) by mouth daily.        Allergies:  Allergies  Allergen Reactions  . Penicillins Anaphylaxis    Family  History: Family History  Problem Relation Age of Onset  . Breast cancer Mother 21    Social History:  reports that she has been smoking Cigarettes.  She has a 11 pack-year smoking history. She has never used smokeless tobacco. She reports that she drinks alcohol. She reports that she does not use illicit drugs.  ROS: Urological Symptom Review  Patient is experiencing the following symptoms: Frequent urination Get up at night to urinate Painful intercourse   Review of Systems  Gastrointestinal (upper)  : Nausea  Gastrointestinal (lower) : Constipation  Constitutional : Fever Night Sweats Weight loss Fatigue  Skin: Negative for skin symptoms  Eyes: Negative for eye symptoms  Ear/Nose/Throat : Sinus problems  Hematologic/Lymphatic: Easy bruising  Cardiovascular : Negative for cardiovascular symptoms  Respiratory : Negative for respiratory symptoms  Endocrine: Negative for endocrine symptoms  Musculoskeletal: Negative for musculoskeletal symptoms  Neurological: Headaches  Psychologic: Negative for psychiatric symptoms   Physical Exam: BP 101/71 mmHg  Pulse 98  Resp 18  Ht 5\' 2"  (1.575 m)  Wt 142 lb 6.4 oz (64.592 kg)  BMI 26.04 kg/m2    Laboratory Data: Results for orders placed or performed in visit on 11/14/14  Microscopic Examination  Result Value Ref Range   WBC, UA 0-5 0 -  5 /hpf   RBC, UA 3-10 (A)  0 -  2 /hpf   Epithelial Cells (non renal) 0-10 0 - 10 /hpf   Bacteria, UA Few None seen/Few  Urinalysis, Complete  Result Value Ref Range   Specific Gravity, UA 1.010 1.005 - 1.030   pH, UA 5.5 5.0 - 7.5   Color, UA Yellow Yellow   Appearance Ur Clear Clear   Leukocytes, UA Negative Negative   Protein, UA Negative Negative/Trace   Glucose, UA Negative Negative   Ketones, UA Negative Negative   RBC, UA 2+ (A) Negative   Bilirubin, UA Negative Negative   Urobilinogen, Ur 0.2 0.2 - 1.0 mg/dL   Nitrite, UA Negative Negative    Microscopic Examination See below:    No results found for: WBC, HGB, HCT, MCV, PLT  No results found for: CREATININE  No results found for: PSA  No results found for: TESTOSTERONE  No results found for: HGBA1C  Urinalysis No results found for: COLORURINE, APPEARANCEUR, LABSPEC, PHURINE, GLUCOSEU, HGBUR, BILIRUBINUR, KETONESUR, PROTEINUR, UROBILINOGEN, NITRITE, LEUKOCYTESUR  Pertinent Imaging:  CLINICAL DATA: Microscopic hematuria. Lower abdominal pain for 1 month.  EXAM: RENAL / URINARY TRACT ULTRASOUND COMPLETE  COMPARISON: 12/28/2006, abdominal ultrasound.  FINDINGS: Right Kidney:  Length: 10.7 cm. Echogenicity within normal limits. No mass or hydronephrosis visualized.  Left Kidney:  Length: 11.4 cm. Echogenicity within normal limits. No mass or hydronephrosis visualized.  Bladder:  Appears normal for degree of bladder distention. Bilateral ureteral jets are seen.  IMPRESSION: Normal renal ultrasound.   Electronically Signed  By: Fidela Salisbury M.D.  On: 10/28/2014 15:19        Assessment & Plan:   1. Microscopic hematuria:  Patient has had 2 episodes of microscopic hematuria. One episode occurring on 10/03/2014 with 11-30 RBCs per high-power field. This was associated with a negative urine culture. The second episode was today with 3-10 RBCs per high-power field.  The patient states she has just finished her menstrual cycle. Patient will return in 3 weeks' time for follow-up UA. If patient still presents with microscopic hematuria, we'll proceed hematuria workup.    2. Recurrent UTIs:  Patient has had 2 episodes of pyelonephritis within the last 2 years. She is currently symptom free.  I have encouraged her to continue to drink large amount of water. I also asked her to start cranberry tablets and probiotics in an effort to prevent further infections.  3. Active smoker:  I explained to the patient because of her smoking history she  is at a higher risk for GU malignancies. She is not interested in quitting at this time. I did emphasize the importance of her returning in 3 weeks for a follow-up UA and pursuing a hematuria workup if the blood in the urine persists.  - Urinalysis, Complete   No Follow-up on file.  Zara Council, [facility] [address] [place], [place] [address] [phone]

## 2014-12-05 ENCOUNTER — Other Ambulatory Visit: Payer: 59

## 2014-12-13 ENCOUNTER — Telehealth: Payer: Self-pay | Admitting: Urology

## 2014-12-13 ENCOUNTER — Other Ambulatory Visit: Payer: 59

## 2014-12-13 DIAGNOSIS — R3129 Other microscopic hematuria: Secondary | ICD-10-CM

## 2014-12-13 NOTE — Telephone Encounter (Signed)
We will need to pursue a hematuria work up because she is still having blood in her urine.  She will need a CT Urogram.

## 2014-12-14 LAB — URINALYSIS, COMPLETE
BILIRUBIN UA: NEGATIVE
GLUCOSE, UA: NEGATIVE
KETONES UA: NEGATIVE
Leukocytes, UA: NEGATIVE
NITRITE UA: NEGATIVE
PH UA: 5.5 (ref 5.0–7.5)
Specific Gravity, UA: 1.02 (ref 1.005–1.030)
UUROB: 0.2 mg/dL (ref 0.2–1.0)

## 2014-12-14 LAB — MICROSCOPIC EXAMINATION

## 2014-12-14 NOTE — Telephone Encounter (Signed)
LMOM

## 2014-12-15 NOTE — Telephone Encounter (Signed)
Spoke with pt in reference to hematuria work up. Pt voiced understanding.

## 2014-12-22 ENCOUNTER — Ambulatory Visit
Admission: RE | Admit: 2014-12-22 | Discharge: 2014-12-22 | Disposition: A | Discharge status: Home / Self Care | Payer: 59 | Source: Ambulatory Visit | Attending: Urology | Admitting: Urology

## 2014-12-22 DIAGNOSIS — R3129 Other microscopic hematuria: Secondary | ICD-10-CM

## 2014-12-22 DIAGNOSIS — R312 Other microscopic hematuria: Secondary | ICD-10-CM | POA: Diagnosis present

## 2014-12-22 MED ORDER — IOHEXOL 300 MG/ML  SOLN
125.0000 mL | Freq: Once | INTRAMUSCULAR | Status: AC | PRN
  Administered 2014-12-22: 125 mL via INTRAVENOUS

## 2014-12-23 ENCOUNTER — Encounter: Payer: Self-pay | Admitting: Urology

## 2014-12-23 ENCOUNTER — Ambulatory Visit (INDEPENDENT_AMBULATORY_CARE_PROVIDER_SITE_OTHER): Payer: 59 | Admitting: Urology

## 2014-12-23 VITALS — BP 115/78 | HR 90 | Temp 98.2°F | Ht 62.0 in | Wt 144.4 lb

## 2014-12-23 DIAGNOSIS — N2889 Other specified disorders of kidney and ureter: Secondary | ICD-10-CM | POA: Diagnosis not present

## 2014-12-23 DIAGNOSIS — R312 Other microscopic hematuria: Secondary | ICD-10-CM | POA: Diagnosis not present

## 2014-12-23 DIAGNOSIS — Z720 Tobacco use: Secondary | ICD-10-CM | POA: Diagnosis not present

## 2014-12-23 DIAGNOSIS — F17200 Nicotine dependence, unspecified, uncomplicated: Secondary | ICD-10-CM

## 2014-12-23 DIAGNOSIS — R3129 Other microscopic hematuria: Secondary | ICD-10-CM

## 2014-12-23 DIAGNOSIS — N390 Urinary tract infection, site not specified: Secondary | ICD-10-CM | POA: Diagnosis not present

## 2014-12-23 NOTE — Progress Notes (Signed)
4:01 PM   Madison Adams 06-Jul-1970 [id number]  Referring provider: No referring provider defined for this encounter.  Chief Complaint  Patient presents with  . Hematuria    CT scan results-negative     HPI: Madison Adams is a 44 year old white female who underwent CT Urogram for evaluation of her persistant microscopic hematuria on 11/14/2014 (3-10 RBC's/hpf) and 12/13/2014 (11-30 RBC's/hpf).    She currently does not have fevers, chills, nausea, vomiting, flank pain, dysuria or gross hematuria. She did have microscopic hematuria on her UA today with 11-30 RBCs seen per high-power field.  She is experiencing a right sided suprapubic pain that she describes as a burning sensation.    She has a h/o pyelonephritis.    I have reviewed the films with the patient.    PMH: Past Medical History  Diagnosis Date  . Kidney infection   . Hematuria, microscopic     Surgical History: Past Surgical History  Procedure Laterality Date  . Tubal ligation  01/14/96  . Breast biopsy Right 10-28-13    FOCUS OF ATYPICAL DUCTAL HYPERPLASIA, 1 MM.  Marland Kitchen Breast surgery Right 01-07-14    1 mm foci of atypical ductal hyperplasia, Gail model risk 3.8%, 5 years; 34.5% lifetime.    Home Medications:    Medication List       This list is accurate as of: 12/23/14  4:01 PM.  Always use your most recent med list.               PROBIOTIC ADVANCED PO  Take by mouth.     raloxifene 60 MG tablet  Commonly known as:  EVISTA  Take 1 tablet (60 mg total) by mouth daily.        Allergies:  Allergies  Allergen Reactions  . Penicillins Anaphylaxis    Family History: Family History  Problem Relation Age of Onset  . Breast cancer Mother 59    Social History:  reports that she has been smoking Cigarettes.  She has a 11 pack-year smoking history. She has never used smokeless tobacco. She reports that she drinks alcohol. She reports that she does not use illicit drugs.  ROS: Urological  Symptom Review  Patient is experiencing the following symptoms: Frequent urination Get up at night to urinate Painful intercourse   Review of Systems  Gastrointestinal (upper)  : Nausea  Gastrointestinal (lower) : Constipation  Constitutional : Fever Night Sweats Weight loss Fatigue  Skin: Negative for skin symptoms  Eyes: Negative for eye symptoms  Ear/Nose/Throat : Sinus problems  Hematologic/Lymphatic: Easy bruising  Cardiovascular : Negative for cardiovascular symptoms  Respiratory : Negative for respiratory symptoms  Endocrine: Negative for endocrine symptoms  Musculoskeletal: Negative for musculoskeletal symptoms  Neurological: Headaches  Psychologic: Negative for psychiatric symptoms   Physical Exam: BP 115/78 mmHg  Pulse 90  Temp(Src) 98.2 F (36.8 C) (Oral)  Ht 5\' 2"  (1.575 m)  Wt 144 lb 6.4 oz (65.499 kg)  BMI 26.40 kg/m2  LMP 12/05/2014    Laboratory Data: Results for orders placed or performed in visit on 12/23/14  Microscopic Examination  Result Value Ref Range   WBC, UA 0-5 0 -  5 /hpf   RBC, UA 11-30 (A) 0 -  2 /hpf   Epithelial Cells (non renal) >10 (A) 0 - 10 /hpf   Renal Epithel, UA None seen None seen /hpf   Bacteria, UA Few (A) None seen/Few  Urinalysis, Complete  Result Value Ref Range   Specific Gravity,  UA 1.025 1.005 - 1.030   pH, UA 5.5 5.0 - 7.5   Color, UA Yellow Yellow   Appearance Ur Hazy (A) Clear   Leukocytes, UA Negative Negative   Protein, UA Negative Negative/Trace   Glucose, UA Negative Negative   Ketones, UA Negative Negative   RBC, UA 3+ (A) Negative   Bilirubin, UA Negative Negative   Urobilinogen, Ur 0.2 0.2 - 1.0 mg/dL   Nitrite, UA Negative Negative   Microscopic Examination See below:    No results found for: WBC, HGB, HCT, MCV, PLT  No results found for: CREATININE  No results found for: PSA  No results found for: TESTOSTERONE  No results found for: HGBA1C  Urinalysis      Component Value Date/Time   COLORURINE Colorless 06/19/2011 1833   APPEARANCEUR Clear 06/19/2011 1833   LABSPEC 1.001 06/19/2011 1833   PHURINE 7.0 06/19/2011 1833   GLUCOSEU Negative 12/13/2014 1723   GLUCOSEU Negative 06/19/2011 1833   HGBUR 2+ 06/19/2011 1833   BILIRUBINUR Negative 12/13/2014 1723   BILIRUBINUR Negative 06/19/2011 1833   KETONESUR Negative 06/19/2011 1833   PROTEINUR Negative 06/19/2011 1833   NITRITE Negative 12/13/2014 1723   NITRITE Negative 06/19/2011 1833   LEUKOCYTESUR Negative 12/13/2014 1723   LEUKOCYTESUR Negative 06/19/2011 1833    Pertinent Imaging: CLINICAL DATA: Hematuria  EXAM: CT ABDOMEN AND PELVIS WITHOUT AND WITH CONTRAST  TECHNIQUE: Multidetector CT imaging of the abdomen and pelvis was performed following the standard protocol before and following the bolus administration of intravenous contrast.  CONTRAST: 143mL OMNIPAQUE IOHEXOL 300 MG/ML SOLN  COMPARISON: None.  FINDINGS: Lower chest: No pleural effusion identified. The lung bases appear clear.  Hepatobiliary: No suspicious liver abnormalities identified. The gallbladder appears normal. There is no biliary dilatation.  Pancreas: Normal appearance of the pancreas  Spleen: The spleen appears normal.  Adrenals/Urinary Tract: The adrenal glands are normal. Small bilateral low attenuation structures in both kidneys measuring less than 1 cm are too small to characterize. No nephrolithiasis identified. On the delayed images there is symmetric excretion of contrast material by both kidneys. No abnormal ureteral or bladder filling defects identified.  Stomach/Bowel: The stomach and the small bowel loops appear normal. The appendix is visualized and appears normal. On unremarkable appearance of the colon.  Vascular/Lymphatic: Normal appearance of the abdominal aorta. No enlarged retroperitoneal or mesenteric adenopathy. No enlarged pelvic or inguinal lymph  nodes.  Reproductive: The uterus and adnexal structures appear normal. Corpus luteal cyst noted within the left ovary.  Other: Trace free fluid noted within the pelvis, likely physiologic.  Musculoskeletal: Review of the visualized bony structures is negative for aggressive lytic or sclerotic bone lesion.  IMPRESSION: 1. No acute findings within the abdomen or pelvis. No findings to explain patient's hematuria.   Electronically Signed  By: Kerby Moors M.D.  On: 12/22/2014 08:48  Assessment & Plan:   1. Microscopic hematuria:  Patient continues to have microscopic hematuria. One episode occurring on 10/03/2014 with 11-30 RBCs per high-power field. This was associated with a negative urine culture. The second episode was on 11/14/2014 with 3-10 RBCs per high-power field.  The patient states she has just finished her menstrual cycle and 11-30 RBC's/hpf.  She underwent CT Urogram and no finding were found to explain the hematuria.   - Urinalysis, Complete  I have explained to the patient that they will  be scheduled for a cystoscopy in our office to evaluate their  lower urinary tract.  The cystoscopy consists of passing  a tube with a lens up through their urethra and into their urinary bladder.   We will inject the urethra with a lidocaine gel prior to introducing the cystoscope to help with any discomfort during the procedure.   After the procedure, they might experience blood in the urine and discomfort with urination.  This will abate after the first few voids.  I have  encouraged the patient to increase water intake  during this time.  Patient denies any allergies to lidocaine.     2. Recurrent UTIs:  Patient has had 2 episodes of pyelonephritis within the last 2 years. She is currently symptom free.  I have encouraged her to continue to drink large amount of water. I also asked her to start cranberry tablets and probiotics in an effort to prevent further infections.  3.  Active smoker:  I explained to the patient because of her smoking history she is at a higher risk for GU malignancies. She is not interested in quitting at this time.  4. Small indeterminate renal masses:   Patient will undergo a RUS in 6 months for follow up of these masses.    No Follow-up on file.  Zara Council, [facility] [address] [place], [place] [address] [phone]

## 2014-12-24 DIAGNOSIS — N2889 Other specified disorders of kidney and ureter: Secondary | ICD-10-CM | POA: Insufficient documentation

## 2014-12-24 DIAGNOSIS — F17200 Nicotine dependence, unspecified, uncomplicated: Secondary | ICD-10-CM | POA: Insufficient documentation

## 2014-12-24 LAB — URINALYSIS, COMPLETE
BILIRUBIN UA: NEGATIVE
GLUCOSE, UA: NEGATIVE
KETONES UA: NEGATIVE
Leukocytes, UA: NEGATIVE
Nitrite, UA: NEGATIVE
PROTEIN UA: NEGATIVE
Specific Gravity, UA: 1.025 (ref 1.005–1.030)
UUROB: 0.2 mg/dL (ref 0.2–1.0)
pH, UA: 5.5 (ref 5.0–7.5)

## 2014-12-24 LAB — MICROSCOPIC EXAMINATION
Epithelial Cells (non renal): >10 /hpf — AB (ref 0–10)
Renal Epithel, UA: NONE SEEN /hpf

## 2015-01-11 ENCOUNTER — Other Ambulatory Visit: Payer: Self-pay | Admitting: General Surgery

## 2015-01-17 ENCOUNTER — Ambulatory Visit (INDEPENDENT_AMBULATORY_CARE_PROVIDER_SITE_OTHER): Payer: 59 | Admitting: Urology

## 2015-01-17 ENCOUNTER — Encounter: Payer: Self-pay | Admitting: Urology

## 2015-01-17 VITALS — BP 139/87 | HR 97 | Ht 62.0 in | Wt 143.2 lb

## 2015-01-17 DIAGNOSIS — R312 Other microscopic hematuria: Secondary | ICD-10-CM | POA: Diagnosis not present

## 2015-01-17 DIAGNOSIS — R3129 Other microscopic hematuria: Secondary | ICD-10-CM

## 2015-01-17 LAB — URINALYSIS, COMPLETE
BILIRUBIN UA: NEGATIVE
GLUCOSE, UA: NEGATIVE
KETONES UA: NEGATIVE
NITRITE UA: NEGATIVE
Protein, UA: NEGATIVE
SPEC GRAV UA: 1.02 (ref 1.005–1.030)
UUROB: 0.2 mg/dL (ref 0.2–1.0)
pH, UA: 7 (ref 5.0–7.5)

## 2015-01-17 LAB — MICROSCOPIC EXAMINATION

## 2015-01-17 MED ORDER — LIDOCAINE HCL 2 % EX GEL
1.0000 "application " | Freq: Once | CUTANEOUS | Status: AC
  Administered 2015-01-17: 1 via URETHRAL

## 2015-01-17 MED ORDER — CIPROFLOXACIN HCL 500 MG PO TABS
500.0000 mg | ORAL_TABLET | Freq: Once | ORAL | Status: AC
  Administered 2015-01-17: 500 mg via ORAL

## 2015-01-17 NOTE — Progress Notes (Signed)
    Cystoscopy Procedure Note  Patient identification was confirmed, informed consent was obtained, and patient was prepped using Betadine solution.  Lidocaine jelly was administered per urethral meatus.    Preoperative abx where received prior to procedure.    Procedure: - Flexible cystoscope introduced, without any difficulty.   - Thorough search of the bladder revealed:    normal urethral meatus    normal urothelium    no stones    no ulcers     no tumors    no urethral polyps    no trabeculation  - Ureteral orifices were normal in position and appearance.  Post-Procedure: - Patient tolerated the procedure well   

## 2015-01-17 NOTE — Progress Notes (Signed)
01/17/2015 4:30 PM   Madison Adams [birth year] [id number]  Referring provider: No referring provider defined for this encounter.  Chief Complaint  Patient presents with  . Cysto    microscopic hematuria    HPI: Microhematuria in a patient with a smoking history N years. I stop smoking. No family history of bladder cancer but family history of breast cancer sexually active. No history of painless gross hematuria just microhematuria will follow-up when necessary for  gross hematuria   PMH: Past Medical History  Diagnosis Date  . Kidney infection   . Hematuria, microscopic     Surgical History: Past Surgical History  Procedure Laterality Date  . Tubal ligation  01/14/96  . Breast biopsy Right 10-28-13    FOCUS OF ATYPICAL DUCTAL HYPERPLASIA, 1 MM.  Marland Kitchen Breast surgery Right 01-07-14    1 mm foci of atypical ductal hyperplasia, Gail model risk 3.8%, 5 years; 34.5% lifetime.    Home Medications:    Medication List       This list is accurate as of: 01/17/15  4:30 PM.  Always use your most recent med list.               PROBIOTIC ADVANCED PO  Take by mouth.     raloxifene 60 MG tablet  Commonly known as:  EVISTA  TAKE 1 TABLET (60 MG TOTAL) BY MOUTH DAILY.        Allergies:  Allergies  Allergen Reactions  . Penicillins Anaphylaxis    Family History: Family History  Problem Relation Age of Onset  . Breast cancer Mother 67    Social History:  reports that she has been smoking Cigarettes.  She started smoking about 18 years ago. She has a 11 pack-year smoking history. She has never used smokeless tobacco. She reports that she drinks alcohol. She reports that she does not use illicit drugs.  ROS: UROLOGY Frequent Urination?: No Hard to postpone urination?: No Burning/pain with urination?: No Get up at night to urinate?: Yes Leakage of urine?: No Urine stream starts and stops?: No Trouble starting stream?: No Do you have to strain to urinate?:  No Blood in urine?: No Urinary tract infection?: No Sexually transmitted disease?: No Injury to kidneys or bladder?: No Painful intercourse?: Yes Weak stream?: No Currently pregnant?: No Vaginal bleeding?: No Last menstrual period?: 01/05/2015  Gastrointestinal Nausea?: No Vomiting?: No Indigestion/heartburn?: No Diarrhea?: No Constipation?: Yes  Constitutional Fever: No Night sweats?: No Weight loss?: No Fatigue?: No  Skin Skin rash/lesions?: No Itching?: No  Eyes Blurred vision?: No Double vision?: No  Ears/Nose/Throat Sore throat?: No Sinus problems?: No  Hematologic/Lymphatic Swollen glands?: No Easy bruising?: Yes  Cardiovascular Leg swelling?: No Chest pain?: No  Respiratory Cough?: No Shortness of breath?: No  Endocrine Excessive thirst?: No  Musculoskeletal Back pain?: No Joint pain?: No  Neurological Headaches?: Yes Dizziness?: No  Psychologic Depression?: No Anxiety?: No  Physical Exam: BP 139/87 mmHg  Pulse 97  Ht 5\' 2"  (1.575 m)  Wt 143 lb 3.2 oz (64.955 kg)  BMI 26.18 kg/m2  LMP 01/05/2015  Constitutional:  Alert and oriented, No acute distress. HEENT: [place] AT, moist mucus membranes.  Trachea midline, no masses. Cardiovascular: No clubbing, cyanosis, or edema. Respiratory: Normal respiratory effort, no increased work of breathing. GI: Abdomen is soft, nontender, nondistended, no abdominal masses GU: No CVA tenderness. Normal introitus Skin: No rashes, bruises or suspicious lesions. Lymph: No cervical or inguinal adenopathy. Neurologic: Grossly intact, no focal deficits, moving all  4 extremities. Psychiatric: Normal mood and affect.  Laboratory Data: No results found for: WBC, HGB, HCT, MCV, PLT  No results found for: CREATININE  No results found for: PSA  No results found for: TESTOSTERONE  No results found for: HGBA1C  Urinalysis    Component Value Date/Time   COLORURINE Colorless 06/19/2011 1833    APPEARANCEUR Clear 06/19/2011 1833   LABSPEC 1.001 06/19/2011 1833   PHURINE 7.0 06/19/2011 1833   GLUCOSEU Negative 12/23/2014 1552   GLUCOSEU Negative 06/19/2011 1833   HGBUR 2+ 06/19/2011 1833   BILIRUBINUR Negative 12/23/2014 1552   BILIRUBINUR Negative 06/19/2011 1833   KETONESUR Negative 06/19/2011 1833   PROTEINUR Negative 06/19/2011 1833   NITRITE Negative 12/23/2014 1552   NITRITE Negative 06/19/2011 1833   LEUKOCYTESUR Negative 12/23/2014 1552   LEUKOCYTESUR Negative 06/19/2011 1833    Pertinent Imaging: Negative CT scan for calculi or mass or growth bladder  Assessment & Plan:  Microhematuria in a smoker  Advised to stop smoking  See only if gross hematuria as cysto was negative  1. Microscopic hematuria  - Urinalysis, Complete - ciprofloxacin (CIPRO) tablet 500 mg; Take 1 tablet (500 mg total) by mouth once. - lidocaine (XYLOCAINE) 2 % jelly 1 application; Place 1 application into the urethra once.   No Follow-up on file.  Collier Flowers, [facility] [address] [place], [place] [address] [phone]

## 2015-08-15 ENCOUNTER — Encounter: Payer: Self-pay | Admitting: General Surgery

## 2015-08-17 ENCOUNTER — Other Ambulatory Visit: Payer: Self-pay

## 2015-08-17 ENCOUNTER — Ambulatory Visit (INDEPENDENT_AMBULATORY_CARE_PROVIDER_SITE_OTHER): Payer: 59 | Admitting: General Surgery

## 2015-08-17 ENCOUNTER — Encounter: Payer: Self-pay | Admitting: General Surgery

## 2015-08-17 VITALS — BP 126/74 | HR 84 | Resp 14 | Ht 62.0 in | Wt 144.0 lb

## 2015-08-17 DIAGNOSIS — N63 Unspecified lump in breast: Secondary | ICD-10-CM | POA: Diagnosis not present

## 2015-08-17 DIAGNOSIS — N6320 Unspecified lump in the left breast, unspecified quadrant: Secondary | ICD-10-CM

## 2015-08-17 DIAGNOSIS — N62 Hypertrophy of breast: Secondary | ICD-10-CM | POA: Diagnosis not present

## 2015-08-17 DIAGNOSIS — N6091 Unspecified benign mammary dysplasia of right breast: Secondary | ICD-10-CM

## 2015-08-17 NOTE — Patient Instructions (Addendum)
The patient is aware to call back for any questions or concerns. Patient will be asked to return to the office in one year with a bilateral screening mammogram. 

## 2015-08-17 NOTE — Progress Notes (Signed)
Patient ID: Madison Adams, female   DOB: 1970-12-13, 45 y.o.   MRN: [id number]  Chief Complaint  Patient presents with  . Follow-up    mammogram    HPI Madison Adams is a 45 y.o. female.  who presents for a breast evaluation. The most recent mammogram was done on 08-11-15 .  Patient does perform regular self breast checks and gets regular mammograms done.  No new breast issues. She has cut back on smokingBreath disease to one half pack per day) and caffeine and feel her breasts are not as tender and the only time they are tender now is with menstrual period.  The patient reports no change on her own self examination.   HPI  Past Medical History  Diagnosis Date  . Kidney infection   . Hematuria, microscopic     Past Surgical History  Procedure Laterality Date  . Tubal ligation  01/14/96  . Breast biopsy Right 10-28-13    FOCUS OF ATYPICAL DUCTAL HYPERPLASIA, 1 MM.  Marland Kitchen Breast surgery Right 01-07-14    1 mm foci of atypical ductal hyperplasia, Gail model risk 3.8%, 5 years; 34.5% lifetime.    Family History  Problem Relation Age of Onset  . Breast cancer Mother 2    Social History Social History  Substance Use Topics  . Smoking status: Current Every Day Smoker -- 0.50 packs/day for 11 years    Types: Cigarettes    Start date: 01/16/1997  . Smokeless tobacco: Never Used  . Alcohol Use: 0.0 oz/week    0 Standard drinks or equivalent per week    Allergies  Allergen Reactions  . Penicillins Anaphylaxis    Current Outpatient Prescriptions  Medication Sig Dispense Refill  . Multiple Vitamin (MULTIVITAMIN) capsule Take 1 capsule by mouth daily.    . Probiotic Product (PROBIOTIC ADVANCED PO) Take by mouth.    . raloxifene (EVISTA) 60 MG tablet TAKE 1 TABLET (60 MG TOTAL) BY MOUTH DAILY. 30 tablet 11   No current facility-administered medications for this visit.    Review of Systems Review of Systems  Constitutional: Negative.   Respiratory: Negative.    Cardiovascular: Negative.     Blood pressure 126/74, pulse 84, resp. rate 14, height 5\' 2"  (1.575 m), weight 144 lb (65.318 kg), last menstrual period 08/10/2015.  Physical Exam Physical Exam  Constitutional: She is oriented to person, place, and time. She appears well-developed and well-nourished.  HENT:  Mouth/Throat: Oropharynx is clear and moist.  Eyes: Conjunctivae are normal. No scleral icterus.  Neck: Neck supple.  Cardiovascular: Normal rate, regular rhythm and normal heart sounds.   Pulmonary/Chest: Effort normal and breath sounds normal. Right breast exhibits no inverted nipple, no mass, no nipple discharge, no skin change and no tenderness. Left breast exhibits no inverted nipple, no mass, no nipple discharge, no skin change and no tenderness.    Small amount volume loss below right breast biopsy site.  Abdominal: Normal appearance. A hernia is present.  Small umbilical hernia.  Lymphadenopathy:    She has no cervical adenopathy.    She has no axillary adenopathy.  Neurological: She is alert and oriented to person, place, and time.  Skin: Skin is warm and dry.  Psychiatric: Her behavior is normal.    Data Reviewed Bilateral mammograms dated 08/11/2015 completed at [facility] were reviewed. Heterogeneously dense. Postbiopsy changes in the right upper outer quadrant. Bilateral asymmetry is unchanged from past exams. BI-RADS-2.  Review of the films shows prominence in the upper-outer quadrant  of the left breast was is likely a cyst, but prominent compared to past studies. It was elected to complete an ultrasound.  Ultrasound examination of the left breast showed a dominant cyst in the 1:00 position 3 cm from the nipple measuring 0.76 x 0.9 x 1.1 cm. This likely corresponds to the dominant lesion on the associated mammograms. At the 6:00 position a 0.45 x 0.79 x 0.79 cm simple cyst is noted. In the 2:00 position 5 cm from the nipple a 0.44 x 0.58 x 0.49 cm simple cyst is  noted. No solid lesions identified. BI-RADS-2.  Assessment    ADH, good tolerance of Evista as chemoprevention.  Multiple left breast cyst, minimally symptomatic.      Plan    The patient will continue on Evista for chemoprevention.     Patient will be asked to return to the office in one year with a bilateral screening mammogram. Continue self breast exams. Call office for any new breast issues or concerns.    PCP:  No Pcp Per Patient This information has been scribed by Karie Fetch RNBC.   Robert Bellow 08/18/2015, 7:34 AM

## 2015-08-18 DIAGNOSIS — N6320 Unspecified lump in the left breast, unspecified quadrant: Secondary | ICD-10-CM | POA: Insufficient documentation

## 2015-10-30 LAB — HM PAP SMEAR: HM PAP: NEGATIVE

## 2016-01-13 ENCOUNTER — Other Ambulatory Visit: Payer: Self-pay | Admitting: General Surgery

## 2016-06-26 ENCOUNTER — Telehealth: Payer: Self-pay

## 2016-06-26 NOTE — Telephone Encounter (Signed)
The patient will go to [facility] for her mammograms this year.

## 2016-06-26 NOTE — Telephone Encounter (Signed)
Message left for patient to call back to let us know where she would like her diagnostic mammogram done this year.

## 2016-07-17 ENCOUNTER — Other Ambulatory Visit: Payer: Self-pay

## 2016-07-17 ENCOUNTER — Other Ambulatory Visit: Payer: Self-pay | Admitting: *Deleted

## 2016-07-17 ENCOUNTER — Inpatient Hospital Stay
Admission: RE | Admit: 2016-07-17 | Discharge: 2016-07-17 | Disposition: A | Discharge status: Home / Self Care | Payer: Self-pay | Source: Ambulatory Visit | Attending: *Deleted | Admitting: *Deleted

## 2016-07-17 DIAGNOSIS — Z9289 Personal history of other medical treatment: Secondary | ICD-10-CM

## 2016-07-17 DIAGNOSIS — N6091 Unspecified benign mammary dysplasia of right breast: Secondary | ICD-10-CM

## 2016-09-02 ENCOUNTER — Telehealth: Payer: Self-pay | Admitting: General Surgery

## 2016-09-02 NOTE — Telephone Encounter (Signed)
L/M FOR PATIENT TO CALL & RESCHEDULE HER APPT FOR 09-26-16 @ 4:15 TO EARLIER THAT AM OR ANOTHER DAY.LET MAUREEN KNOW WHEN RESCHEDULE SO SHE CAN BLOCK TIME/MTH

## 2016-09-17 ENCOUNTER — Ambulatory Visit
Admission: RE | Admit: 2016-09-17 | Discharge: 2016-09-17 | Disposition: A | Discharge status: Home / Self Care | Payer: 59 | Source: Ambulatory Visit | Attending: General Surgery | Admitting: General Surgery

## 2016-09-17 DIAGNOSIS — N6091 Unspecified benign mammary dysplasia of right breast: Secondary | ICD-10-CM

## 2016-09-17 DIAGNOSIS — Z9889 Other specified postprocedural states: Secondary | ICD-10-CM | POA: Diagnosis not present

## 2016-09-19 ENCOUNTER — Encounter: Payer: Self-pay | Admitting: *Deleted

## 2016-09-26 ENCOUNTER — Encounter: Payer: Self-pay | Admitting: General Surgery

## 2016-09-26 ENCOUNTER — Ambulatory Visit (INDEPENDENT_AMBULATORY_CARE_PROVIDER_SITE_OTHER): Payer: 59 | Admitting: General Surgery

## 2016-09-26 VITALS — BP 120/78 | HR 103 | Resp 14 | Ht 62.0 in | Wt 146.0 lb

## 2016-09-26 DIAGNOSIS — N6091 Unspecified benign mammary dysplasia of right breast: Secondary | ICD-10-CM

## 2016-09-26 DIAGNOSIS — K429 Umbilical hernia without obstruction or gangrene: Secondary | ICD-10-CM | POA: Diagnosis not present

## 2016-09-26 NOTE — Patient Instructions (Addendum)
The patient is aware to call back for any questions or concerns.  Umbilical Hernia, Adult A hernia is a bulge of tissue that pushes through an opening between muscles. An umbilical hernia happens in the abdomen, near the belly button (umbilicus). The hernia may contain tissues from the small intestine, large intestine, or fatty tissue covering the intestines (omentum). Umbilical hernias in adults tend to get worse over time, and they require surgical treatment. There are several types of umbilical hernias. You may have:  A hernia located just above or below the umbilicus (indirect hernia). This is the most common type of umbilical hernia in adults.  A hernia that forms through an opening formed by the umbilicus (direct hernia).  A hernia that comes and goes (reducible hernia). A reducible hernia may be visible only when you strain, lift something heavy, or cough. This type of hernia can be pushed back into the abdomen (reduced).  A hernia that traps abdominal tissue inside the hernia (incarcerated hernia). This type of hernia cannot be reduced.  A hernia that cuts off blood flow to the tissues inside the hernia (strangulated hernia). The tissues can start to die if this happens. This type of hernia requires emergency treatment. What are the causes? An umbilical hernia happens when tissue inside the abdomen presses on a weak area of the abdominal muscles. What increases the risk? You may have a greater risk of this condition if you:  Are obese.  Have had several pregnancies.  Have a buildup of fluid inside your abdomen (ascites).  Have had surgery that weakens the abdominal muscles. What are the signs or symptoms? The main symptom of this condition is a painless bulge at or near the belly button. A reducible hernia may be visible only when you strain, lift something heavy, or cough. Other symptoms may include:  Dull pain.  A feeling of pressure. Symptoms of a strangulated hernia may  include:  Pain that gets increasingly worse.  Nausea and vomiting.  Pain when pressing on the hernia.  Skin over the hernia becoming red or purple.  Constipation.  Blood in the stool. How is this diagnosed? This condition may be diagnosed based on:  A physical exam. You may be asked to cough or strain while standing. These actions increase the pressure inside your abdomen and force the hernia through the opening in your muscles. Your health care provider may try to reduce the hernia by pressing on it.  Your symptoms and medical history. How is this treated? Surgery is the only treatment for an umbilical hernia. Surgery for a strangulated hernia is done as soon as possible. If you have a small hernia that is not incarcerated, you may need to lose weight before having surgery. Follow these instructions at home:  Lose weight, if told by your health care provider.  Do not try to push the hernia back in.  Watch your hernia for any changes in color or size. Tell your health care provider if any changes occur.  You may need to avoid activities that increase pressure on your hernia.  Do not lift anything that is heavier than 10 lb (4.5 kg) until your health care provider says that this is safe.  Take over-the-counter and prescription medicines only as told by your health care provider.  Keep all follow-up visits as told by your health care provider. This is important. Contact a health care provider if:  Your hernia gets larger.  Your hernia becomes painful. Get help right away if:  You develop sudden, severe pain near the area of your hernia.  You have pain as well as nausea or vomiting.  You have pain and the skin over your hernia changes color.  You develop a fever. This information is not intended to replace advice given to you by your health care provider. Make sure you discuss any questions you have with your health care provider. Document Released: 10/13/2015 Document  Revised: 01/14/2016 Document Reviewed: 10/13/2015 Elsevier Interactive Patient Education  2017 [employer].  The patient has been asked to return to the office in one year with a bilateral diagnostic mammogram.

## 2016-09-26 NOTE — Progress Notes (Signed)
Patient ID: Madison Adams, female   DOB: September 20, 1970, 46 y.o.   MRN: [id number]  Chief Complaint  Patient presents with  . Follow-up    mammogram    HPI Madison Adams is a 46 y.o. female.  who presents for a breast evaluation. The most recent mammogram was done on 09-17-16 .  Patient does perform regular self breast checks and gets regular mammograms done.   No new breast issues. She has a grandchild coming in September.  HPI  Past Medical History:  Diagnosis Date  . Hematuria, microscopic   . Kidney infection     Past Surgical History:  Procedure Laterality Date  . BREAST BIOPSY Right 10-28-13   FOCUS OF ATYPICAL DUCTAL HYPERPLASIA, 1 MM.  Marland Kitchen BREAST SURGERY Right 01-07-14   1 mm foci of atypical ductal hyperplasia, Gail model risk 3.8%, 5 years; 34.5% lifetime.  . TUBAL LIGATION  01/14/96    Family History  Problem Relation Age of Onset  . Breast cancer Mother 72    Social History Social History  Substance Use Topics  . Smoking status: Current Every Day Smoker    Packs/day: 0.50    Years: 11.00    Types: Cigarettes    Start date: 01/16/1997  . Smokeless tobacco: Never Used  . Alcohol use 0.0 oz/week    Allergies  Allergen Reactions  . Penicillins Anaphylaxis    Current Outpatient Prescriptions  Medication Sig Dispense Refill  . Multiple Vitamin (MULTIVITAMIN) capsule Take 1 capsule by mouth daily.    . Probiotic Product (PROBIOTIC ADVANCED PO) Take by mouth.    . raloxifene (EVISTA) 60 MG tablet TAKE 1 TABLET (60 MG TOTAL) BY MOUTH DAILY. 30 tablet 11   No current facility-administered medications for this visit.     Review of Systems Review of Systems  Constitutional: Negative.   Respiratory: Negative.   Cardiovascular: Negative.     Blood pressure 120/78, pulse (!) 103, resp. rate 14, height 5\' 2"  (1.575 m), weight 146 lb (66.2 kg), last menstrual period 09/16/2016.  Physical Exam Physical Exam  Constitutional: She is oriented to person, place,  and time. She appears well-developed and well-nourished.  HENT:  Mouth/Throat: Oropharynx is clear and moist.  Eyes: Conjunctivae are normal. No scleral icterus.  Neck: Neck supple.  Cardiovascular: Normal rate, regular rhythm and normal heart sounds.   Pulmonary/Chest: Effort normal and breath sounds normal. Right breast exhibits tenderness. Right breast exhibits no inverted nipple, no mass, no nipple discharge and no skin change. Left breast exhibits no inverted nipple, no mass, no nipple discharge, no skin change and no tenderness.    Mild volume loss right breast. Tender right axillary tail.  Abdominal: Soft. Normal appearance. There is tenderness. A hernia is present.  Small umbilical hernia, partially reducible.  Lymphadenopathy:    She has no cervical adenopathy.    She has no axillary adenopathy.  Neurological: She is alert and oriented to person, place, and time.  Skin: Skin is warm and dry.  Psychiatric: Her behavior is normal.    Data Reviewed 09/17/2016 mammograms reviewed. No interval change.    Assessment    ADH, tolerating estrogen suppression well.    Umbilical hernia, small area of incarcerated fat, minimally symptomatic.    Plan        Discussed umbilical hernia precautions and possible surgery. The patient has been asked to return to the office in one year with a bilateral diagnostic mammogram.    HPI, Physical Exam, Assessment and Plan  have been scribed under the direction and in the presence of Robert Bellow, MD.  Karie Fetch, RN I have completed the exam and reviewed the above documentation for accuracy and completeness.  I agree with the above.  Haematologist has been used and any errors in dictation or transcription are unintentional.  Hervey Ard, M.D., F.A.C.S.   Robert Bellow 09/27/2016, 8:41 AM

## 2016-09-27 DIAGNOSIS — K429 Umbilical hernia without obstruction or gangrene: Secondary | ICD-10-CM | POA: Insufficient documentation

## 2016-10-03 ENCOUNTER — Encounter: Payer: Self-pay | Admitting: *Deleted

## 2016-10-03 NOTE — Progress Notes (Signed)
Patient's surgery has been scheduled for 10-30-16 at [facility].   This patient's surgery instructions will be reviewed at pre-op appointment scheduled for 10-10-16.

## 2016-10-10 ENCOUNTER — Ambulatory Visit (INDEPENDENT_AMBULATORY_CARE_PROVIDER_SITE_OTHER): Payer: 59 | Admitting: General Surgery

## 2016-10-10 ENCOUNTER — Encounter: Payer: Self-pay | Admitting: General Surgery

## 2016-10-10 VITALS — BP 118/68 | HR 96 | Resp 12 | Ht 62.0 in | Wt 146.0 lb

## 2016-10-10 DIAGNOSIS — K429 Umbilical hernia without obstruction or gangrene: Secondary | ICD-10-CM

## 2016-10-10 NOTE — Progress Notes (Signed)
Patient ID: Madison Adams, female   DOB: [birth year], 46 y.o.   MRN: [id number]  Chief Complaint  Patient presents with  . Pre-op Exam    HPI Madison Adams is a 46 y.o. female. The patient has begun to become symptomatic from her umbilical hernia. Surgery presently scheduled for 10/30/2016.  HPI  Past Medical History:  Diagnosis Date  . Hematuria, microscopic   . Kidney infection     Past Surgical History:  Procedure Laterality Date  . BREAST BIOPSY Right 10-28-13   FOCUS OF ATYPICAL DUCTAL HYPERPLASIA, 1 MM.  Marland Kitchen BREAST SURGERY Right 01-07-14   1 mm foci of atypical ductal hyperplasia, Gail model risk 3.8%, 5 years; 34.5% lifetime.  . TUBAL LIGATION  01/14/96    Family History  Problem Relation Age of Onset  . Breast cancer Mother 81    Social History Social History  Substance Use Topics  . Smoking status: Current Every Day Smoker    Packs/day: 0.50    Years: 11.00    Types: Cigarettes    Start date: 01/16/1997  . Smokeless tobacco: Never Used  . Alcohol use 0.0 oz/week    Allergies  Allergen Reactions  . Penicillins Anaphylaxis  . Hydrocodone Nausea Only    Current Outpatient Prescriptions  Medication Sig Dispense Refill  . Multiple Vitamin (MULTIVITAMIN) capsule Take 1 capsule by mouth daily.    . Probiotic Product (PROBIOTIC ADVANCED PO) Take by mouth.    . raloxifene (EVISTA) 60 MG tablet TAKE 1 TABLET (60 MG TOTAL) BY MOUTH DAILY. 30 tablet 11   No current facility-administered medications for this visit.     Review of Systems Review of Systems  Constitutional: Negative.   Respiratory: Negative.   Cardiovascular: Negative.   Neurological: Positive for headaches.    Blood pressure 118/68, pulse 96, resp. rate 12, height 5\' 2"  (1.575 m), weight 146 lb (66.2 kg), last menstrual period 09/16/2016.  Physical Exam Physical Exam  Constitutional: She is oriented to person, place, and time. She appears well-developed and well-nourished.  HENT:   Mouth/Throat: Oropharynx is clear and moist.  Eyes: Conjunctivae are normal. No scleral icterus.  Neck: Neck supple.  Cardiovascular: Normal rate, regular rhythm and normal heart sounds.   Pulmonary/Chest: Effort normal and breath sounds normal.  Abdominal: Soft. Normal appearance and bowel sounds are normal. A hernia is present.    Small umbilical hernia defect  Lymphadenopathy:    She has no cervical adenopathy.  Neurological: She is alert and oriented to person, place, and time.  Skin: Skin is warm and dry.  Psychiatric: Her behavior is normal.       Assessment    Symptomatic until hernia.    Plan    Based on size I think this is likely going to be repaired primarily. Role of prosthetic mesh discussed.    Hernia precautions and incarceration were discussed with the patient. If they develop symptoms of an incarcerated hernia, they were encouraged to seek prompt medical attention.  I have recommended repair of the hernia on an outpatient basis in the near future. The risk of infection was reviewed. No role of mesh anticipated.  Avoid hydrocodone postoperatively   HPI, Physical Exam, Assessment and Plan have been scribed under the direction and in the presence of Robert Bellow, MD.  Karie Fetch, RN  I have completed the exam and reviewed the above documentation for accuracy and completeness.  I agree with the above.  Haematologist has been used and any  errors in dictation or transcription are unintentional.  Hervey Ard, M.D., F.A.C.S.  Robert Bellow 10/11/2016, 6:48 AM   Patient's surgery has been scheduled for 10-30-16 at [facility].   Dominga Ferry, CMA

## 2016-10-10 NOTE — Patient Instructions (Signed)
The patient is aware to call back for any questions or concerns.  Hernia, Adult A hernia is the bulging of an organ or tissue through a weak spot in the muscles of the abdomen (abdominal wall). Hernias develop most often near the navel or groin. There are many kinds of hernias. Common kinds include:  Femoral hernia. This kind of hernia develops under the groin in the upper thigh area.  Inguinal hernia. This kind of hernia develops in the groin or scrotum.  Umbilical hernia. This kind of hernia develops near the navel.  Hiatal hernia. This kind of hernia causes part of the stomach to be pushed up into the chest.  Incisional hernia. This kind of hernia bulges through a scar from an abdominal surgery.  What are the causes? This condition may be caused by:  Heavy lifting.  Coughing over a long period of time.  Straining to have a bowel movement.  An incision made during an abdominal surgery.  A birth defect (congenital defect).  Excess weight or obesity.  Smoking.  Poor nutrition.  Cystic fibrosis.  Excess fluid in the abdomen.  Undescended testicles.  What are the signs or symptoms? Symptoms of a hernia include:  A lump on the abdomen. This is the first sign of a hernia. The lump may become more obvious with standing, straining, or coughing. It may get bigger over time if it is not treated or if the condition causing it is not treated.  Pain. A hernia is usually painless, but it may become painful over time if treatment is delayed. The pain is usually dull and may get worse with standing or lifting heavy objects.  Sometimes a hernia gets tightly squeezed in the weak spot (strangulated) or stuck there (incarcerated) and causes additional symptoms. These symptoms may include:  Vomiting.  Nausea.  Constipation.  Irritability.  How is this diagnosed? A hernia may be diagnosed with:  A physical exam. During the exam your health care provider may ask you to cough  or to make a specific movement, because a hernia is usually more visible when you move.  Imaging tests. These can include: ? X-rays. ? Ultrasound. ? CT scan.  How is this treated? A hernia that is small and painless may not need to be treated. A hernia that is large or painful may be treated with surgery. Inguinal hernias may be treated with surgery to prevent incarceration or strangulation. Strangulated hernias are always treated with surgery, because lack of blood to the trapped organ or tissue can cause it to die. Surgery to treat a hernia involves pushing the bulge back into place and repairing the weak part of the abdomen. Follow these instructions at home:  Avoid straining.  Do not lift anything heavier than 10 lb (4.5 kg).  Lift with your leg muscles, not your back muscles. This helps avoid strain.  When coughing, try to cough gently.  Prevent constipation. Constipation leads to straining with bowel movements, which can make a hernia worse or cause a hernia repair to break down. You can prevent constipation by: ? Eating a high-fiber diet that includes plenty of fruits and vegetables. ? Drinking enough fluids to keep your urine clear or pale yellow. Aim to drink 6-8 glasses of water per day. ? Using a stool softener as directed by your health care provider.  Lose weight, if you are overweight.  Do not use any tobacco products, including cigarettes, chewing tobacco, or electronic cigarettes. If you need help quitting, ask your health   care provider.  Keep all follow-up visits as directed by your health care provider. This is important. Your health care provider may need to monitor your condition. Contact a health care provider if:  You have swelling, redness, and pain in the affected area.  Your bowel habits change. Get help right away if:  You have a fever.  You have abdominal pain that is getting worse.  You feel nauseous or you vomit.  You cannot push the hernia back  in place by gently pressing on it while you are lying down.  The hernia: ? Changes in shape or size. ? Is stuck outside the abdomen. ? Becomes discolored. ? Feels hard or tender. This information is not intended to replace advice given to you by your health care provider. Make sure you discuss any questions you have with your health care provider. Document Released: 05/13/2005 Document Revised: 10/11/2015 Document Reviewed: 03/23/2014 Elsevier Interactive Patient Education  2017 [employer]  

## 2016-10-23 ENCOUNTER — Encounter
Admission: RE | Admit: 2016-10-23 | Discharge: 2016-10-23 | Disposition: A | Discharge status: Home / Self Care | Payer: 59 | Source: Ambulatory Visit | Attending: General Surgery | Admitting: General Surgery

## 2016-10-23 HISTORY — DX: Cardiac murmur, unspecified: R01.1

## 2016-10-23 HISTORY — DX: Anemia, unspecified: D64.9

## 2016-10-23 HISTORY — DX: Family history of other specified conditions: Z84.89

## 2016-10-23 NOTE — Patient Instructions (Addendum)
  Your procedure is scheduled on: 10-30-16 Report to Same Day Surgery 2nd floor medical mall [facility] Entrance-take elevator on left to 2nd floor.  Check in with surgery information desk.) To find out your arrival time please call [phone] between 1PM - 3PM on 10-29-16  Remember: Instructions that are not followed completely may result in serious medical risk, up to and including death, or upon the discretion of your surgeon and anesthesiologist your surgery may need to be rescheduled.    _x___ 1. Do not eat food or drink liquids after midnight. No gum chewing or                              hard candies.     __x__ 2. No Alcohol for 24 hours before or after surgery.   __x__3. No Smoking for 24 prior to surgery.   ____  4. Bring all medications with you on the day of surgery if instructed.    __x__ 5. Notify your doctor if there is any change in your medical condition     (cold, fever, infections).     Do not wear jewelry, make-up, hairpins, clips or nail polish.  Do not wear lotions, powders, or perfumes. You may wear deodorant.  Do not shave 48 hours prior to surgery. Men may shave face and neck.  Do not bring valuables to the hospital.    [facility] is not responsible for any belongings or valuables.               Contacts, dentures or bridgework may not be worn into surgery.  Leave your suitcase in the car. After surgery it may be brought to your room.  For patients admitted to the hospital, discharge time is determined by your                       treatment team.   Patients discharged the day of surgery will not be allowed to drive home.  You will need someone to drive you home and stay with you the night of your procedure.    Please read over the following fact sheets that you were given:   [facility] Preparing for Surgery and or MRSA Information   ___ Take anti-hypertensive (unless it includes a diuretic), cardiac, seizure, asthma,     anti-reflux and psychiatric  medicines. These include:  1. NONE  2.  3.  4.  5.  6.  ____Fleets enema or Magnesium Citrate as directed.   ____ Use CHG Soap or sage wipes as directed on instruction sheet   ____ Use inhalers on the day of surgery and bring to hospital day of surgery  ____ Stop Metformin and Janumet 2 days prior to surgery.    ____ Take 1/2 of usual insulin dose the night before surgery and none on the morning     surgery.   ____ Follow recommendations from Cardiologist, Pulmonologist or PCP regarding stopping Aspirin, Coumadin, Pllavix ,Eliquis, Effient, or Pradaxa, and Pletal.  ____Stop Anti-inflammatories such as Advil, Aleve, Ibuprofen, Motrin, Naproxen, Naprosyn, Goodies powders or aspirin products-PT STOPPED BC POWDERS LAST WEEK-OK to take Tylenol    ____ Stop supplements until after surgery   ____ Bring C-Pap to the hospital.

## 2016-10-29 ENCOUNTER — Encounter: Payer: Self-pay | Admitting: *Deleted

## 2016-10-30 ENCOUNTER — Encounter
Admission: RE | Disposition: A | Discharge status: Home / Self Care | Payer: Self-pay | Source: Ambulatory Visit | Attending: General Surgery

## 2016-10-30 ENCOUNTER — Ambulatory Visit
Admission: RE | Admit: 2016-10-30 | Discharge: 2016-10-30 | Disposition: A | Discharge status: Home / Self Care | Payer: 59 | Source: Ambulatory Visit | Attending: General Surgery | Admitting: General Surgery

## 2016-10-30 ENCOUNTER — Encounter: Payer: Self-pay | Admitting: *Deleted

## 2016-10-30 ENCOUNTER — Ambulatory Visit: Payer: 59 | Admitting: Certified Registered"

## 2016-10-30 DIAGNOSIS — F17200 Nicotine dependence, unspecified, uncomplicated: Secondary | ICD-10-CM | POA: Diagnosis not present

## 2016-10-30 DIAGNOSIS — J449 Chronic obstructive pulmonary disease, unspecified: Secondary | ICD-10-CM | POA: Diagnosis not present

## 2016-10-30 DIAGNOSIS — K429 Umbilical hernia without obstruction or gangrene: Secondary | ICD-10-CM | POA: Diagnosis not present

## 2016-10-30 HISTORY — PX: UMBILICAL HERNIA REPAIR: SHX196

## 2016-10-30 HISTORY — DX: Tubulo-interstitial nephritis, not specified as acute or chronic: N12

## 2016-10-30 LAB — POCT PREGNANCY, URINE: Preg Test, Ur: NEGATIVE

## 2016-10-30 SURGERY — REPAIR, HERNIA, UMBILICAL, ADULT
Anesthesia: General | Wound class: Clean

## 2016-10-30 MED ORDER — FENTANYL CITRATE (PF) 100 MCG/2ML IJ SOLN
INTRAMUSCULAR | Status: AC
  Filled 2016-10-30: qty 2

## 2016-10-30 MED ORDER — FAMOTIDINE 20 MG PO TABS
20.0000 mg | ORAL_TABLET | Freq: Once | ORAL | Status: AC
  Administered 2016-10-30: 20 mg via ORAL

## 2016-10-30 MED ORDER — LIDOCAINE HCL (CARDIAC) 20 MG/ML IV SOLN
INTRAVENOUS | Status: DC | PRN
  Administered 2016-10-30: 50 mg via INTRAVENOUS

## 2016-10-30 MED ORDER — EPHEDRINE SULFATE 50 MG/ML IJ SOLN
INTRAMUSCULAR | Status: AC
  Filled 2016-10-30: qty 1

## 2016-10-30 MED ORDER — FENTANYL CITRATE (PF) 100 MCG/2ML IJ SOLN
25.0000 ug | INTRAMUSCULAR | Status: AC | PRN
  Administered 2016-10-30 (×6): 25 ug via INTRAVENOUS

## 2016-10-30 MED ORDER — FENTANYL CITRATE (PF) 100 MCG/2ML IJ SOLN
INTRAMUSCULAR | Status: DC | PRN
  Administered 2016-10-30 (×2): 50 ug via INTRAVENOUS

## 2016-10-30 MED ORDER — MIDAZOLAM HCL 2 MG/2ML IJ SOLN
INTRAMUSCULAR | Status: AC
  Filled 2016-10-30: qty 2

## 2016-10-30 MED ORDER — KETOROLAC TROMETHAMINE 30 MG/ML IJ SOLN
INTRAMUSCULAR | Status: DC | PRN
  Administered 2016-10-30: 30 mg via INTRAVENOUS

## 2016-10-30 MED ORDER — ONDANSETRON HCL 4 MG/2ML IJ SOLN: 4.0000 mg | Freq: Once | INTRAMUSCULAR | Status: DC | PRN

## 2016-10-30 MED ORDER — LACTATED RINGERS IV SOLN
INTRAVENOUS | Status: DC
  Administered 2016-10-30: 13:00:00 via INTRAVENOUS

## 2016-10-30 MED ORDER — LIDOCAINE HCL (PF) 2 % IJ SOLN
INTRAMUSCULAR | Status: AC
  Filled 2016-10-30: qty 2

## 2016-10-30 MED ORDER — DEXAMETHASONE SODIUM PHOSPHATE 10 MG/ML IJ SOLN
INTRAMUSCULAR | Status: AC
  Filled 2016-10-30: qty 1

## 2016-10-30 MED ORDER — PROPOFOL 10 MG/ML IV BOLUS
INTRAVENOUS | Status: AC
  Filled 2016-10-30: qty 20

## 2016-10-30 MED ORDER — ONDANSETRON HCL 4 MG/2ML IJ SOLN
INTRAMUSCULAR | Status: DC | PRN
  Administered 2016-10-30: 4 mg via INTRAVENOUS

## 2016-10-30 MED ORDER — BUPIVACAINE HCL (PF) 0.5 % IJ SOLN
INTRAMUSCULAR | Status: AC
  Filled 2016-10-30: qty 30

## 2016-10-30 MED ORDER — TRAMADOL HCL 50 MG PO TABS: 50.0000 mg | ORAL_TABLET | ORAL | 0 refills | Status: DC | PRN

## 2016-10-30 MED ORDER — SODIUM CHLORIDE 0.9 % IJ SOLN
INTRAMUSCULAR | Status: AC
  Filled 2016-10-30: qty 10

## 2016-10-30 MED ORDER — KETOROLAC TROMETHAMINE 30 MG/ML IJ SOLN
INTRAMUSCULAR | Status: AC
  Filled 2016-10-30: qty 1

## 2016-10-30 MED ORDER — BUPIVACAINE HCL 0.5 % IJ SOLN
INTRAMUSCULAR | Status: DC | PRN
  Administered 2016-10-30: 30 mL

## 2016-10-30 MED ORDER — MIDAZOLAM HCL 2 MG/2ML IJ SOLN
INTRAMUSCULAR | Status: DC | PRN
  Administered 2016-10-30: 2 mg via INTRAVENOUS

## 2016-10-30 MED ORDER — ONDANSETRON HCL 4 MG/2ML IJ SOLN
INTRAMUSCULAR | Status: AC
  Filled 2016-10-30: qty 2

## 2016-10-30 MED ORDER — PROPOFOL 10 MG/ML IV BOLUS
INTRAVENOUS | Status: DC | PRN
Start: 2016-10-30 — End: 2016-10-30
  Administered 2016-10-30: 140 mg via INTRAVENOUS

## 2016-10-30 MED ORDER — EPHEDRINE SULFATE 50 MG/ML IJ SOLN
INTRAMUSCULAR | Status: DC | PRN
  Administered 2016-10-30: 5 mg via INTRAVENOUS

## 2016-10-30 MED ORDER — FAMOTIDINE 20 MG PO TABS
ORAL_TABLET | ORAL | Status: AC
  Administered 2016-10-30: 20 mg via ORAL
  Filled 2016-10-30: qty 1

## 2016-10-30 MED ORDER — DEXAMETHASONE SODIUM PHOSPHATE 10 MG/ML IJ SOLN
INTRAMUSCULAR | Status: DC | PRN
  Administered 2016-10-30: 4 mg via INTRAVENOUS

## 2016-10-30 SURGICAL SUPPLY — 30 items
BLADE SURG 15 STRL SS SAFETY (BLADE) ×2 IMPLANT
CANISTER SUCT 1200ML W/VALVE (MISCELLANEOUS) ×2 IMPLANT
CHLORAPREP W/TINT 26ML (MISCELLANEOUS) ×2 IMPLANT
DRAPE LAPAROTOMY 100X77 ABD (DRAPES) ×2 IMPLANT
DRSG TEGADERM 4X4.75 (GAUZE/BANDAGES/DRESSINGS) ×2 IMPLANT
DRSG TELFA 4X3 1S NADH ST (GAUZE/BANDAGES/DRESSINGS) ×2 IMPLANT
ELECT REM PT RETURN 9FT ADLT (ELECTROSURGICAL) ×2
ELECTRODE REM PT RTRN 9FT ADLT (ELECTROSURGICAL) ×1 IMPLANT
GLOVE BIO SURGEON STRL SZ7.5 (GLOVE) ×2 IMPLANT
GLOVE INDICATOR 8.0 STRL GRN (GLOVE) ×2 IMPLANT
GOWN STRL REUS W/ TWL LRG LVL3 (GOWN DISPOSABLE) ×2 IMPLANT
GOWN STRL REUS W/TWL LRG LVL3 (GOWN DISPOSABLE) ×4
KIT RM TURNOVER STRD PROC AR (KITS) ×2 IMPLANT
LABEL OR SOLS (LABEL) ×2 IMPLANT
NDL HYPO 25X1 1.5 SAFETY (NEEDLE) ×1 IMPLANT
NDL SAFETY 22GX1.5 (NEEDLE) ×4 IMPLANT
NEEDLE HYPO 25X1 1.5 SAFETY (NEEDLE) ×2 IMPLANT
NS IRRIG 500ML POUR BTL (IV SOLUTION) ×2 IMPLANT
PACK BASIN MINOR ARMC (MISCELLANEOUS) ×2 IMPLANT
STRIP CLOSURE SKIN 1/2X4 (GAUZE/BANDAGES/DRESSINGS) ×2 IMPLANT
SUT PROLENE 0 CT 1 30 (SUTURE) ×2 IMPLANT
SUT SURGILON 0 BLK (SUTURE) ×4 IMPLANT
SUT VIC AB 3-0 54X BRD REEL (SUTURE) ×2 IMPLANT
SUT VIC AB 3-0 BRD 54 (SUTURE) ×4
SUT VIC AB 3-0 SH 27 (SUTURE) ×2
SUT VIC AB 3-0 SH 27X BRD (SUTURE) ×1 IMPLANT
SUT VIC AB 4-0 FS2 27 (SUTURE) ×2 IMPLANT
SWABSTK COMLB BENZOIN TINCTURE (MISCELLANEOUS) ×2 IMPLANT
SYR 3ML LL SCALE MARK (SYRINGE) ×2 IMPLANT
SYR CONTROL 10ML (SYRINGE) ×4 IMPLANT

## 2016-10-30 NOTE — OR Nursing (Signed)
Umbilical incision covered with gauze and tegaderm no drainage noted

## 2016-10-30 NOTE — Op Note (Signed)
Preoperative diagnosis: Umbilical hernia.  Postoperative diagnosis: Same.  Operative procedure primary repair of umbilical hernia.  Operating surgeon: Ollen Bowl, M.D.  Anesthesia: Gen. by LMA, Marcaine 0.5% plain, 30 mL.   Blood loss: Less than 2 mL.  Clinical note: This 46 year old female has developed a symptomatic umbilical hernia. She'll resume for elective repair.  Operative note: The patient underwent general anesthesia without difficulty. The abdomen was prepped with ChloraPrep and draped. Marcaine was infiltrated for field block anesthesia. An infraumbilical incision was made and carried at the skin. Hemostasis was electrocautery. The sac was excised from the undersurface of the umbilical skin. This was transected at the fascial level and discarded. The defect measured less than 10 mm in diameter. The contents were reduced. Interrupted 0 Surgilon sutures were placed under direct vision and tied sequentially. The other local skin was tacked to the fascia with a 3-0 Vicryl figure-of-eight suture. The adipose layer was closed with a running 3-0 Vicryl suture. Skin was closed with a running 4-0 Vicryl septic suture. Benzoin, Steri-Strips, Telfa and Tegaderm dressing applied.  The patient tolerated the procedure well was taken to the recovery room in stable condition.

## 2016-10-30 NOTE — Transfer of Care (Signed)
Immediate Anesthesia Transfer of Care Note  Patient: Madison Adams  Procedure(s) Performed: Procedure(s): HERNIA REPAIR UMBILICAL ADULT (N/A)  Patient Location: PACU  Anesthesia Type:General  Level of Consciousness: sedated  Airway & Oxygen Therapy: Patient connected to face mask oxygen  Post-op Assessment: Post -op Vital signs reviewed and stable  Post vital signs: stable  Last Vitals:  Vitals:   10/30/16 1244 10/30/16 1407  BP: 124/80 108/67  Pulse: 74 62  Resp: 16 18  Temp: 36.9 C 36.6 C    Last Pain:  Vitals:   10/30/16 1244  TempSrc: Oral         Complications: No apparent anesthesia complications

## 2016-10-30 NOTE — Anesthesia Postprocedure Evaluation (Signed)
Anesthesia Post Note  Patient: Madison Adams  Procedure(s) Performed: Procedure(s) (LRB): HERNIA REPAIR UMBILICAL ADULT (N/A)  Patient location during evaluation: PACU Anesthesia Type: General Level of consciousness: awake Pain management: pain level controlled Vital Signs Assessment: post-procedure vital signs reviewed and stable Respiratory status: spontaneous breathing Cardiovascular status: stable Anesthetic complications: no     Last Vitals:  Vitals:   10/30/16 1244 10/30/16 1407  BP: 124/80 108/67  Pulse: 74 62  Resp: 16 18  Temp: 36.9 C 36.6 C    Last Pain:  Vitals:   10/30/16 1244  TempSrc: Oral                 VAN STAVEREN,Shatera Rennert

## 2016-10-30 NOTE — H&P (Signed)
No interval change from recent office visit.  For umbilical hernia repair.

## 2016-10-30 NOTE — Anesthesia Post-op Follow-up Note (Cosign Needed)
Anesthesia QCDR form completed.        

## 2016-10-30 NOTE — Anesthesia Procedure Notes (Signed)
Procedure Name: LMA Insertion Performed by: Maison Agrusa Pre-anesthesia Checklist: Patient identified, Patient being monitored, Timeout performed, Emergency Drugs available and Suction available Patient Re-evaluated:Patient Re-evaluated prior to inductionOxygen Delivery Method: Circle system utilized Preoxygenation: Pre-oxygenation with 100% oxygen Intubation Type: IV induction LMA: LMA inserted LMA Size: 3.5 Tube type: Oral Number of attempts: 1 Placement Confirmation: positive ETCO2 and breath sounds checked- equal and bilateral Tube secured with: Tape Dental Injury: Teeth and Oropharynx as per pre-operative assessment        

## 2016-10-30 NOTE — Discharge Instructions (Signed)

## 2016-10-30 NOTE — Anesthesia Preprocedure Evaluation (Signed)
Anesthesia Evaluation  Patient identified by MRN, date of birth, ID band Patient awake    Reviewed: Allergy & Precautions, NPO status , Patient's Chart, lab work & pertinent test results  Airway Mallampati: II       Dental  (+) Teeth Intact   Pulmonary COPD, Current Smoker,    breath sounds clear to auscultation       Cardiovascular Exercise Tolerance: Good  Rhythm:Regular     Neuro/Psych    GI/Hepatic negative GI ROS, Neg liver ROS,   Endo/Other  negative endocrine ROS  Renal/GU negative Renal ROS     Musculoskeletal   Abdominal Normal abdominal exam  (+)   Peds negative pediatric ROS (+)  Hematology negative hematology ROS (+) anemia ,   Anesthesia Other Findings   Reproductive/Obstetrics                             Anesthesia Physical Anesthesia Plan  ASA: II  Anesthesia Plan: General   Post-op Pain Management:    Induction: Intravenous  PONV Risk Score and Plan: 2 and Ondansetron, Dexamethasone and Treatment may vary due to age  Airway Management Planned: LMA  Additional Equipment:   Intra-op Plan:   Post-operative Plan: Extubation in OR  Informed Consent: I have reviewed the patients History and Physical, chart, labs and discussed the procedure including the risks, benefits and alternatives for the proposed anesthesia with the patient or authorized representative who has indicated his/her understanding and acceptance.     Plan Discussed with: CRNA  Anesthesia Plan Comments:         Anesthesia Quick Evaluation

## 2016-10-31 ENCOUNTER — Telehealth: Payer: Self-pay

## 2016-10-31 ENCOUNTER — Encounter: Payer: Self-pay | Admitting: General Surgery

## 2016-10-31 NOTE — Telephone Encounter (Signed)
Patient called and is still having a lot of discomfort and pain with her surgery from yesterday. She had been prescribed Tramadol and said that it provided no relief for her. She has been using 600-800 mg Ibuprofen every 4-6 hours and applying a heating pad to the area with some relief but thinks she needs something stronger for the pain. She denies any fever or chills and no bleeding at surgical site.

## 2016-10-31 NOTE — Telephone Encounter (Signed)
Patient to have family member pick up prescription for Percocet. Patient is amendable to this.

## 2016-11-06 ENCOUNTER — Encounter: Payer: Self-pay | Admitting: General Surgery

## 2016-11-06 ENCOUNTER — Ambulatory Visit (INDEPENDENT_AMBULATORY_CARE_PROVIDER_SITE_OTHER): Payer: 59 | Admitting: General Surgery

## 2016-11-06 ENCOUNTER — Encounter: Payer: Self-pay | Admitting: *Deleted

## 2016-11-06 ENCOUNTER — Telehealth: Payer: Self-pay | Admitting: General Surgery

## 2016-11-06 VITALS — BP 120/70 | HR 70 | Resp 12 | Ht 62.0 in | Wt 144.0 lb

## 2016-11-06 DIAGNOSIS — K429 Umbilical hernia without obstruction or gangrene: Secondary | ICD-10-CM

## 2016-11-06 NOTE — Telephone Encounter (Signed)
Faxed letter to work and sent letter to patient at home address.

## 2016-11-06 NOTE — Telephone Encounter (Signed)
PATIENT CALLED TO GIVE THE RETURN TO WORK NOTE INFORMATION SHE HAD SPOKEN TO YOU ABOUT WHILE IN THE OFFICE THIS MORNING.SHE NEEDS A DOCTORS NOTE STATING SHE IN UNABLE TO LIFT 30 POUNDS FROM 11-14-16 THRU 12-05-16.PLEASE FAX NOTE TO [phone].IF YOU NEED TO TALK WITH PATIENT SHE CAN BE REACHED AT [phone]

## 2016-11-06 NOTE — Patient Instructions (Addendum)
Patient may return to work on 11/14/2016 and no lifting over 30 pounds until 7/12/20187.Return as needed. Proper lifting techniques reviewed.

## 2016-11-06 NOTE — Progress Notes (Signed)
Patient ID: Madison Adams, female   DOB: 02/03/1971, 46 y.o.   MRN: [id number]  Chief Complaint  Patient presents with  . Routine Post Op    umbilical hernia    HPI Madison Adams is a 46 y.o. female here today for her post op umbilical hernia repair done on 10/30/16. Patient states she is doing.  HPI  Past Medical History:  Diagnosis Date  . Anemia    WITH PREGNANCY ONLY  . Family history of adverse reaction to anesthesia    MOM-N/V  . Heart murmur    AS A CHILD  . Hematuria, microscopic   . Pyelonephritis     Past Surgical History:  Procedure Laterality Date  . BREAST BIOPSY Right 10-28-13   FOCUS OF ATYPICAL DUCTAL HYPERPLASIA, 1 MM.  Marland Kitchen BREAST SURGERY Right 01-07-14   1 mm foci of atypical ductal hyperplasia, Gail model risk 3.8%, 5 years; 34.5% lifetime.  . TUBAL LIGATION  01/14/96  . UMBILICAL HERNIA REPAIR N/A 10/30/2016   Procedure: HERNIA REPAIR UMBILICAL ADULT;  Surgeon: Robert Bellow, MD;  Location: [facility] ORS;  Service: General;  Laterality: N/A;    Family History  Problem Relation Age of Onset  . Breast cancer Mother 64    Social History Social History  Substance Use Topics  . Smoking status: Current Every Day Smoker    Packs/day: 0.50    Years: 11.00    Types: Cigarettes    Start date: 01/16/1997  . Smokeless tobacco: Never Used  . Alcohol use No    Allergies  Allergen Reactions  . Penicillins Anaphylaxis, Hives, Shortness Of Breath and Swelling    Has patient had a PCN reaction causing immediate rash, facial/tongue/throat swelling, SOB or lightheadedness with hypotension: Yes Has patient had a PCN reaction causing severe rash involving mucus membranes or skin necrosis: Yes Has patient had a PCN reaction that required hospitalization:Treated in ER Has patient had a PCN reaction occurring within the last 10 years: No If all of the above answers are "NO", then may proceed with Cephalosporin use. Childhood allergy  . Hydrocodone Nausea Only     Current Outpatient Prescriptions  Medication Sig Dispense Refill  . Aspirin-Salicylamide-Caffeine (BC FAST PAIN RELIEF) 650-195-33.3 MG PACK Take 1 packet by mouth every 8 (eight) hours as needed (for pain/headache.).    Marland Kitchen ibuprofen (ADVIL,MOTRIN) 200 MG tablet Take 400-800 mg by mouth every 8 (eight) hours as needed (for pain/headache.).    Marland Kitchen Multiple Vitamin (MULTIVITAMIN WITH MINERALS) TABS tablet Take 1 tablet by mouth daily. Women's Multivitamin    . Probiotic Product (PROBIOTIC PO) Take 1 capsule by mouth daily.    . raloxifene (EVISTA) 60 MG tablet TAKE 1 TABLET (60 MG TOTAL) BY MOUTH DAILY. (Patient taking differently: TAKE 1 TABLET (60 MG TOTAL) BY MOUTH DAILY BETWEEN 3 PM & 4:30 PM.) 30 tablet 11  . traMADol (ULTRAM) 50 MG tablet Take 1 tablet (50 mg total) by mouth every 4 (four) hours as needed. 20 tablet 0   No current facility-administered medications for this visit.     Review of Systems Review of Systems  Constitutional: Negative.   Respiratory: Negative.   Cardiovascular: Negative.     Blood pressure 120/70, pulse 70, resp. rate 12, height 5\' 2"  (1.575 m), weight 144 lb (65.3 kg), last menstrual period 10/14/2016.  Physical Exam Physical Exam  Constitutional: She is oriented to person, place, and time. She appears well-developed and well-nourished.  Abdominal: Normal appearance. No hernia.  Umbilical hernia  repair is intact and healing well.   Neurological: She is alert and oriented to person, place, and time.  Skin: Skin is warm and dry.       Assessment    Doing well status post primary repair of a small fascial defect at the umbilicus.    Plan         Patient may return to work on 11/14/2016 and no lifting over 30 pounds until 12/05/2016. Return as needed. Proper lifting techniques reviewed.   HPI, Physical Exam, Assessment and Plan have been scribed under the direction and in the presence of Hervey Ard, MD.  Gaspar Cola, CMA  I have  completed the exam and reviewed the above documentation for accuracy and completeness.  I agree with the above.  Haematologist has been used and any errors in dictation or transcription are unintentional.  Hervey Ard, M.D., F.A.C.S.      Robert Bellow 11/08/2016, 7:54 AM

## 2017-01-01 ENCOUNTER — Ambulatory Visit
Admission: EM | Admit: 2017-01-01 | Discharge: 2017-01-01 | Disposition: A | Discharge status: Home / Self Care | Payer: 59 | Attending: Family Medicine | Admitting: Family Medicine

## 2017-01-01 DIAGNOSIS — R42 Dizziness and giddiness: Secondary | ICD-10-CM | POA: Diagnosis present

## 2017-01-01 DIAGNOSIS — H8113 Benign paroxysmal vertigo, bilateral: Secondary | ICD-10-CM

## 2017-01-01 DIAGNOSIS — H8110 Benign paroxysmal vertigo, unspecified ear: Secondary | ICD-10-CM | POA: Insufficient documentation

## 2017-01-01 DIAGNOSIS — Z880 Allergy status to penicillin: Secondary | ICD-10-CM | POA: Diagnosis not present

## 2017-01-01 DIAGNOSIS — F17210 Nicotine dependence, cigarettes, uncomplicated: Secondary | ICD-10-CM | POA: Insufficient documentation

## 2017-01-01 DIAGNOSIS — H8193 Unspecified disorder of vestibular function, bilateral: Secondary | ICD-10-CM | POA: Diagnosis not present

## 2017-01-01 DIAGNOSIS — Z803 Family history of malignant neoplasm of breast: Secondary | ICD-10-CM | POA: Insufficient documentation

## 2017-01-01 DIAGNOSIS — Z87440 Personal history of urinary (tract) infections: Secondary | ICD-10-CM | POA: Diagnosis not present

## 2017-01-01 DIAGNOSIS — R110 Nausea: Secondary | ICD-10-CM

## 2017-01-01 LAB — GLUCOSE, CAPILLARY: Glucose-Capillary: 87 mg/dL (ref 65–99)

## 2017-01-01 MED ORDER — ONDANSETRON 8 MG PO TBDP: 8.0000 mg | ORAL_TABLET | Freq: Two times a day (BID) | ORAL | 0 refills | Status: DC

## 2017-01-01 MED ORDER — MECLIZINE HCL 25 MG PO TABS: 25.0000 mg | ORAL_TABLET | Freq: Three times a day (TID) | ORAL | 0 refills | Status: DC | PRN

## 2017-01-01 NOTE — ED Provider Notes (Addendum)
[facility]    CSN: [id number] Arrival date & time: 01/01/17  1016     History   Chief Complaint Chief Complaint  Patient presents with  . Dizziness    HPI Madison Adams is a 46 y.o. female.   HPI  Patient presents with complaints of dizziness started suddenly at work this morning. He states that anytime she moves her head or if she moves her body she feels as if the room is spinning around her.The symptoms last for only a few seconds and then returned to normal. As she sits still she is fine she says She's had some nausea but no vomiting. She denies any ringing or buzzing in her ear denies loss of hearing. She denied any peripheral changes with loss of function numbness or tingling. She has not had any syncopal episode. She denies any visual disturbances. She denies any drainage from her ears. Denies any chest pain shortness of breath.        Past Medical History:  Diagnosis Date  . Anemia    WITH PREGNANCY ONLY  . Family history of adverse reaction to anesthesia    MOM-N/V  . Heart murmur    AS A CHILD  . Hematuria, microscopic   . Pyelonephritis     Patient Active Problem List   Diagnosis Date Noted  . Umbilical hernia without obstruction and without gangrene 09/27/2016  . Left breast mass 08/18/2015  . Bilateral renal masses 12/24/2014  . Smoker 12/24/2014  . Microscopic hematuria 11/14/2014  . Recurrent UTI 11/14/2014  . Smoker unmotivated to quit 11/14/2014  . Atypical ductal hyperplasia of right breast 12/15/2013  . Lump or mass in breast 10/29/2013  . Diffuse cystic mastopathy of both breasts 09/09/2013  . Breast mass 09/09/2013    Past Surgical History:  Procedure Laterality Date  . BREAST BIOPSY Right 10-28-13   FOCUS OF ATYPICAL DUCTAL HYPERPLASIA, 1 MM.  Marland Kitchen BREAST SURGERY Right 01-07-14   1 mm foci of atypical ductal hyperplasia, Gail model risk 3.8%, 5 years; 34.5% lifetime.  . TUBAL LIGATION  01/14/96  . UMBILICAL HERNIA REPAIR N/A  10/30/2016   Procedure: HERNIA REPAIR UMBILICAL ADULT;  Surgeon: Robert Bellow, MD;  Location: [facility] ORS;  Service: General;  Laterality: N/A;    OB History    Gravida Para Term Preterm AB Living   3 3       3    SAB TAB Ectopic Multiple Live Births                  Obstetric Comments   1st Menstrual Cycle: 14  1st Pregnancy:  19       Home Medications    Prior to Admission medications   Medication Sig Start Date End Date Taking? Authorizing Provider  Aspirin-Salicylamide-Caffeine (BC FAST PAIN RELIEF) 650-195-33.3 MG PACK Take 1 packet by mouth every 8 (eight) hours as needed (for pain/headache.).   Yes [provider]  ibuprofen (ADVIL,MOTRIN) 200 MG tablet Take 400-800 mg by mouth every 8 (eight) hours as needed (for pain/headache.).   Yes [provider]  Multiple Vitamin (MULTIVITAMIN WITH MINERALS) TABS tablet Take 1 tablet by mouth daily. Women's Multivitamin   Yes [provider]  Probiotic Product (PROBIOTIC PO) Take 1 capsule by mouth daily.   Yes [provider]  raloxifene (EVISTA) 60 MG tablet TAKE 1 TABLET (60 MG TOTAL) BY MOUTH DAILY. Patient taking differently: TAKE 1 TABLET (60 MG TOTAL) BY MOUTH DAILY BETWEEN 3 PM &  4:30 PM. 01/13/16  Yes Byrnett, Forest Gleason, MD  meclizine (ANTIVERT) 25 MG tablet Take 1 tablet (25 mg total) by mouth 3 (three) times daily as needed for dizziness. 01/01/17   Lorin Picket, PA-C  ondansetron (ZOFRAN ODT) 8 MG disintegrating tablet Take 1 tablet (8 mg total) by mouth 2 (two) times daily. Prn nausea 01/01/17   Lorin Picket, PA-C  traMADol (ULTRAM) 50 MG tablet Take 1 tablet (50 mg total) by mouth every 4 (four) hours as needed. 10/30/16 10/30/17  Robert Bellow, MD    Family History Family History  Problem Relation Age of Onset  . Breast cancer Mother 54  . Healthy Father     Social History Social History  Substance Use Topics  . Smoking status: Current Every Day Smoker    Packs/day: 0.50     Years: 11.00    Types: Cigarettes    Start date: 01/16/1997  . Smokeless tobacco: Current User  . Alcohol use No     Allergies   Penicillins   Review of Systems Review of Systems  Constitutional: Positive for activity change and appetite change. Negative for chills, fatigue and fever.  HENT: Negative for ear discharge, ear pain, hearing loss, sinus pain, sinus pressure and tinnitus.   Neurological: Positive for dizziness. Negative for tremors, syncope and weakness.  All other systems reviewed and are negative.    Physical Exam Triage Vital Signs ED Triage Vitals  Enc Vitals Group     BP 01/01/17 1109 132/86     Pulse Rate 01/01/17 1109 74     Resp 01/01/17 1109 16     Temp 01/01/17 1109 98.3 F (36.8 C)     Temp Source 01/01/17 1109 Oral     SpO2 01/01/17 1109 100 %     Weight 01/01/17 1112 145 lb (65.8 kg)     Height 01/01/17 1112 5\' 2"  (1.575 m)     Head Circumference --      Peak Flow --      Pain Score --      Pain Loc --      Pain Edu? --      Excl. in [place]? --    Orthostatic VS for the past 24 hrs:  BP- Lying Pulse- Lying BP- Sitting Pulse- Sitting BP- Standing at 0 minutes  01/01/17 1131 128/79 69 130/83 73 126/87    Updated Vital Signs BP 132/86 (BP Location: Left Arm)   Pulse 74   Temp 98.3 F (36.8 C) (Oral)   Resp 16   Ht 5\' 2"  (1.575 m)   Wt 145 lb (65.8 kg)   LMP 11/30/2016 (Approximate)   SpO2 100%   BMI 26.52 kg/m   Visual Acuity Right Eye Distance:   Left Eye Distance:   Bilateral Distance:    Right Eye Near:   Left Eye Near:    Bilateral Near:     Physical Exam  Constitutional: She is oriented to person, place, and time. She appears well-developed and well-nourished. No distress.  HENT:  Head: Normocephalic.  Right Ear: External ear normal.  Left Ear: External ear normal.  Nose: Nose normal.  Mouth/Throat: Oropharynx is clear and moist. No oropharyngeal exudate.  Eyes: Pupils are equal, round, and reactive to light. Right  eye exhibits no discharge. Left eye exhibits no discharge.  Examination of the EOMs are incomplete due to the patient's complaints of dizziness and nausea. PERRLA.  Neck: Neck supple.  All of motion of her head causes  dizziness and nausea.  Musculoskeletal: Normal range of motion.  Lymphadenopathy:    She has no cervical adenopathy.  Neurological: She is alert and oriented to person, place, and time. She displays normal reflexes. No cranial nerve deficit or sensory deficit. She exhibits normal muscle tone. Coordination abnormal.  Patient exhibits mild ataxia due to dizziness with movement.  Skin: Skin is warm and dry. She is not diaphoretic.  Psychiatric: She has a normal mood and affect. Her behavior is normal. Judgment and thought content normal.  Nursing note and vitals reviewed.    UC Treatments / Results  Labs (all labs ordered are listed, but only abnormal results are displayed) Labs Reviewed  GLUCOSE, CAPILLARY    EKG  EKG Interpretation None       Radiology No results found.  Procedures Procedures (including critical care time)  Medications Ordered in UC Medications - No data to display   Initial Impression / Assessment and Plan / UC Course  I have reviewed the triage vital signs and the nursing notes.  Pertinent labs & imaging results that were available during my care of the patient were reviewed by me and considered in my medical decision making (see chart for details).    Plan: 1. Test/x-ray results and diagnosis reviewed with patient 2. rx as per orders; risks, benefits, potential side effects reviewed with patient 3. Recommend supportive treatment with limited activities especially no driving. If she worsens I recommend she follow-up in the emergency department. Otherwise I would encourage her to make an appointment with ENT next week. We'll try Antivert for symptom relief and Zofran for possible nausea. She should continue with extra hydration to keep her  urine clear. Keep out of work today and tomorrow with return on Friday if she is improving 4. F/u prn if symptoms worsen or don't improve     Final Clinical Impressions(s) / UC Diagnoses   Final diagnoses:  Vertigo  Benign paroxysmal positional vertigo due to bilateral vestibular disorder    New Prescriptions Discharge Medication List as of 01/01/2017 12:19 PM    START taking these medications   Details  meclizine (ANTIVERT) 25 MG tablet Take 1 tablet (25 mg total) by mouth 3 (three) times daily as needed for dizziness., Starting Wed 01/01/2017, Normal    ondansetron (ZOFRAN ODT) 8 MG disintegrating tablet Take 1 tablet (8 mg total) by mouth 2 (two) times daily. Prn nausea, Starting Wed 01/01/2017, Normal         Controlled Substance Prescriptions [place] Controlled Substance Registry consulted? Not Applicable   Lorin Picket, PA-C 01/01/17 1322    Lorin Picket, PA-C 01/01/17 1944

## 2017-01-01 NOTE — ED Triage Notes (Signed)
46 year old Caucasian woman is here today with complaints of dizziness that started this morning while at work. She states anytime she started moving around she would get very dizzy.

## 2017-01-02 ENCOUNTER — Other Ambulatory Visit: Payer: Self-pay | Admitting: General Surgery

## 2017-01-02 ENCOUNTER — Telehealth: Payer: Self-pay | Admitting: *Deleted

## 2017-01-02 MED ORDER — RALOXIFENE HCL 60 MG PO TABS: 60.0000 mg | ORAL_TABLET | Freq: Every day | ORAL | 4 refills | Status: DC

## 2017-01-02 NOTE — Telephone Encounter (Signed)
Patient notified of new RX that was sent into [facility].

## 2017-01-02 NOTE — Progress Notes (Signed)
RX for 90 supply of Evista sent to [facility].

## 2017-03-27 NOTE — Progress Notes (Signed)
Gynecology Annual Exam  PCP: Patient, No Pcp Per  Chief Complaint:  Chief Complaint  Patient presents with  . Gynecologic Exam    irregular periods 2 in August    History of Present Illness: Patient is a 46 y.o. [id number] presents for annual exam. The patient has no complaints today.   LMP: Patient's last menstrual period was 03/14/2017. Getting slightly less predictable in frequency and length.  Every 26-35 days and at time heavy and crampy   The patient is sexually active. She currently uses tubal ligation for contraception. She denies dyspareunia.  The patient does perform self breast exams.  There is notable family history of breast cancer in her family.  The patient wears seatbelts: yes.   The patient has regular exercise: not asked.    The patient denies current symptoms of depression.    Review of Systems: Review of Systems  Constitutional: Negative for chills and fever.  HENT: Negative for congestion.   Respiratory: Negative for cough and shortness of breath.   Cardiovascular: Negative for chest pain and palpitations.  Gastrointestinal: Negative for abdominal pain, constipation, diarrhea, heartburn, nausea and vomiting.  Genitourinary: Negative for dysuria, frequency and urgency.  Skin: Negative for itching and rash.  Neurological: Negative for dizziness and headaches.  Endo/Heme/Allergies: Negative for polydipsia.  Psychiatric/Behavioral: Negative for depression.    Past Medical History:  Past Medical History:  Diagnosis Date  . Anemia    WITH PREGNANCY ONLY  . Family history of adverse reaction to anesthesia    MOM-N/V  . Heart murmur    AS A CHILD  . Hematuria, microscopic   . Ovarian cyst   . Pyelonephritis     Past Surgical History:  Past Surgical History:  Procedure Laterality Date  . BREAST BIOPSY Right 10-28-13   FOCUS OF ATYPICAL DUCTAL HYPERPLASIA, 1 MM.  Marland Kitchen BREAST SURGERY Right 01-07-14   1 mm foci of atypical ductal hyperplasia, Gail model  risk 3.8%, 5 years; 34.5% lifetime.  . TUBAL LIGATION  01/14/96    Gynecologic History:  Patient's last menstrual period was 03/14/2017. Contraception: tubal ligation Last Pap: Results were: 10/30/2015 NIL HPV negative  Last mammogram: 09/17/2016 Results were: BI-RAD II Obstetric History: [id number]  Family History:  Family History  Problem Relation Age of Onset  . Breast cancer Mother 85  . Healthy Father   . Heart failure Paternal Grandfather 74    Social History:  Social History   Socioeconomic History  . Marital status: Single    Spouse name: Not on file  . Number of children: Not on file  . Years of education: Not on file  . Highest education level: Not on file  Social Needs  . Financial resource strain: Not on file  . Food insecurity - worry: Not on file  . Food insecurity - inability: Not on file  . Transportation needs - medical: Not on file  . Transportation needs - non-medical: Not on file  Occupational History  . Not on file  Tobacco Use  . Smoking status: Current Every Day Smoker    Packs/day: 0.50    Years: 11.00    Pack years: 5.50    Types: Cigarettes    Start date: 01/16/1997  . Smokeless tobacco: Current User  Substance and Sexual Activity  . Alcohol use: No    Alcohol/week: 0.0 oz  . Drug use: No  . Sexual activity: Yes    Birth control/protection: None  Other Topics Concern  . Not on file  Social History Narrative  . Not on file    Allergies:  Allergies  Allergen Reactions  . Penicillins Anaphylaxis, Hives, Shortness Of Breath and Swelling    Has patient had a PCN reaction causing immediate rash, facial/tongue/throat swelling, SOB or lightheadedness with hypotension: Yes Has patient had a PCN reaction causing severe rash involving mucus membranes or skin necrosis: Yes Has patient had a PCN reaction that required hospitalization:Treated in ER Has patient had a PCN reaction occurring within the last 10 years: No If all of the above answers  are "NO", then may proceed with Cephalosporin use. Childhood allergy    Medications: Prior to Admission medications   Medication Sig Start Date End Date Taking? Authorizing Provider  Aspirin-Salicylamide-Caffeine (BC FAST PAIN RELIEF) [phone] MG PACK Take 1 packet by mouth every 8 (eight) hours as needed (for pain/headache.).    [provider]  ibuprofen (ADVIL,MOTRIN) 200 MG tablet Take 400-800 mg by mouth every 8 (eight) hours as needed (for pain/headache.).    [provider]  meclizine (ANTIVERT) 25 MG tablet Take 1 tablet (25 mg total) by mouth 3 (three) times daily as needed for dizziness. 01/01/17   Lorin Picket, PA-C  Multiple Vitamin (MULTIVITAMIN WITH MINERALS) TABS tablet Take 1 tablet by mouth daily. Women's Multivitamin    [provider]  ondansetron (ZOFRAN ODT) 8 MG disintegrating tablet Take 1 tablet (8 mg total) by mouth 2 (two) times daily. Prn nausea 01/01/17   Lorin Picket, PA-C  Probiotic Product (PROBIOTIC PO) Take 1 capsule by mouth daily.    [provider]  raloxifene (EVISTA) 60 MG tablet Take 1 tablet (60 mg total) by mouth daily. 01/02/17   Robert Bellow, MD  traMADol (ULTRAM) 50 MG tablet Take 1 tablet (50 mg total) by mouth every 4 (four) hours as needed. 10/30/16 10/30/17  Robert Bellow, MD    Physical Exam Vitals: There were no vitals taken for this visit.  General: NAD HEENT: normocephalic, anicteric Thyroid: no enlargement, no palpable nodules Pulmonary: No increased work of breathing, CTAB Cardiovascular: RRR, distal pulses 2+ Breast: Breast symmetrical, no tenderness, no palpable nodules or masses, no skin or nipple retraction present, no nipple discharge.  No axillary or supraclavicular lymphadenopathy. Abdomen: NABS, soft, non-tender, non-distended.  Umbilicus without lesions.  No hepatomegaly, splenomegaly or masses palpable. No evidence of hernia  Genitourinary:  External: Normal external female  genitalia.  Normal urethral meatus, normal  Bartholin's and Skene's glands.    Vagina: Normal vaginal mucosa, no evidence of prolapse.    Cervix: Grossly normal in appearance, no bleeding  Uterus: Non-enlarged, mobile, normal contour.  No CMT  Adnexa: ovaries non-enlarged, no adnexal masses  Rectal: deferred  Lymphatic: no evidence of inguinal lymphadenopathy Extremities: no edema, erythema, or tenderness Neurologic: Grossly intact Psychiatric: mood appropriate, affect full  Female chaperone present for pelvic and breast  portions of the physical exam    Assessment: 46 y.o. [id number] routine annual exam  Plan: Problem List Items Addressed This Visit    None    Visit Diagnoses    Encounter for well woman exam with routine gynecological exam    -  Primary   Relevant Orders   CMP14+LP+TP+TSH+CBC/Plt (Completed)   Cervical cancer screening       Relevant Orders   PapIG, HPV, rfx 16/18   Screening for malignant neoplasm of cervix       Screening for diabetes mellitus       Relevant Orders   CMP14+LP+TP+TSH+CBC/Plt (  Completed)   Screening for lipoid disorders       Relevant Orders   CMP14+LP+TP+TSH+CBC/Plt (Completed)   Breast screening       Encounter for gynecological examination without abnormal finding       Relevant Orders   CMP14+LP+TP+TSH+CBC/Plt (Completed)   Thyroid disorder screening       Relevant Orders   CMP14+LP+TP+TSH+CBC/Plt (Completed)   Abnormal uterine bleeding       Relevant Orders   US PELVIS TRANSVANGINAL NON-OB (TV ONLY)      1) Mammogram - recommend yearly screening mammogram.  Mammogram Is up to date - [facility] Negative April 2014 - Next mammogram 08/2017 DIAGNOSTIC secondary to atypical ductal hyperplasia 01/07/2014 - On Evista for chemoprophylaxis, being followed by Dr. Hervey Ard  2) STI screening was offered and declined  3) ASCCP guidelines and rational discussed.  Patient opts for yearly screening interval  4) Contraception - s/p prior  BTL  5) Colonoscopy -- Screening recommended starting at age 71 for average risk individuals, age 12 for individuals deemed at increased risk (including African Americans) and recommended to continue until age 34.  For patient age 39-85 individualized approach is recommended.  Gold standard screening is via colonoscopy, Cologuard screening is an acceptable alternative for patient unwilling or unable to undergo colonoscopy.  "Colorectal cancer screening for average?risk adults: 2018 guideline update from the American Cancer Society"CA: A Cancer Journal for Clinicians: Oct 23, 2016   6) Routine healthcare maintenance including cholesterol, diabetes screening discussed Ordered today

## 2017-03-28 ENCOUNTER — Encounter: Payer: Self-pay | Admitting: Obstetrics and Gynecology

## 2017-03-28 ENCOUNTER — Ambulatory Visit (INDEPENDENT_AMBULATORY_CARE_PROVIDER_SITE_OTHER): Payer: 59 | Admitting: Obstetrics and Gynecology

## 2017-03-28 VITALS — BP 110/64 | HR 87 | Ht 62.0 in | Wt 156.0 lb

## 2017-03-28 DIAGNOSIS — N939 Abnormal uterine and vaginal bleeding, unspecified: Secondary | ICD-10-CM | POA: Diagnosis not present

## 2017-03-28 DIAGNOSIS — Z1239 Encounter for other screening for malignant neoplasm of breast: Secondary | ICD-10-CM

## 2017-03-28 DIAGNOSIS — Z13220 Encounter for screening for lipoid disorders: Secondary | ICD-10-CM | POA: Diagnosis not present

## 2017-03-28 DIAGNOSIS — Z124 Encounter for screening for malignant neoplasm of cervix: Secondary | ICD-10-CM | POA: Diagnosis not present

## 2017-03-28 DIAGNOSIS — Z01419 Encounter for gynecological examination (general) (routine) without abnormal findings: Secondary | ICD-10-CM

## 2017-03-28 DIAGNOSIS — Z1329 Encounter for screening for other suspected endocrine disorder: Secondary | ICD-10-CM | POA: Diagnosis not present

## 2017-03-28 DIAGNOSIS — Z131 Encounter for screening for diabetes mellitus: Secondary | ICD-10-CM

## 2017-03-28 DIAGNOSIS — Z1231 Encounter for screening mammogram for malignant neoplasm of breast: Secondary | ICD-10-CM

## 2017-03-28 NOTE — Patient Instructions (Signed)
Preventive Care 40-64 Years, Female Preventive care refers to lifestyle choices and visits with your health care provider that can promote health and wellness. What does preventive care include?  A yearly physical exam. This is also called an annual well check.  Dental exams once or twice a year.  Routine eye exams. Ask your health care provider how often you should have your eyes checked.  Personal lifestyle choices, including: ? Daily care of your teeth and gums. ? Regular physical activity. ? Eating a healthy diet. ? Avoiding tobacco and drug use. ? Limiting alcohol use. ? Practicing safe sex. ? Taking low-dose aspirin daily starting at age 58. ? Taking vitamin and mineral supplements as recommended by your health care provider. What happens during an annual well check? The services and screenings done by your health care provider during your annual well check will depend on your age, overall health, lifestyle risk factors, and family history of disease. Counseling Your health care provider may ask you questions about your:  Alcohol use.  Tobacco use.  Drug use.  Emotional well-being.  Home and relationship well-being.  Sexual activity.  Eating habits.  Work and work Statistician.  Method of birth control.  Menstrual cycle.  Pregnancy history.  Screening You may have the following tests or measurements:  Height, weight, and BMI.  Blood pressure.  Lipid and cholesterol levels. These may be checked every 5 years, or more frequently if you are over 81 years old.  Skin check.  Lung cancer screening. You may have this screening every year starting at age 78 if you have a 30-pack-year history of smoking and currently smoke or have quit within the past 15 years.  Fecal occult blood test (FOBT) of the stool. You may have this test every year starting at age 65.  Flexible sigmoidoscopy or colonoscopy. You may have a sigmoidoscopy every 5 years or a colonoscopy  every 10 years starting at age 30.  Hepatitis C blood test.  Hepatitis B blood test.  Sexually transmitted disease (STD) testing.  Diabetes screening. This is done by checking your blood sugar (glucose) after you have not eaten for a while (fasting). You may have this done every 1-3 years.  Mammogram. This may be done every 1-2 years. Talk to your health care provider about when you should start having regular mammograms. This may depend on whether you have a family history of breast cancer.  BRCA-related cancer screening. This may be done if you have a family history of breast, ovarian, tubal, or peritoneal cancers.  Pelvic exam and Pap test. This may be done every 3 years starting at age 80. Starting at age 36, this may be done every 5 years if you have a Pap test in combination with an HPV test.  Bone density scan. This is done to screen for osteoporosis. You may have this scan if you are at high risk for osteoporosis.  Discuss your test results, treatment options, and if necessary, the need for more tests with your health care provider. Vaccines Your health care provider may recommend certain vaccines, such as:  Influenza vaccine. This is recommended every year.  Tetanus, diphtheria, and acellular pertussis (Tdap, Td) vaccine. You may need a Td booster every 10 years.  Varicella vaccine. You may need this if you have not been vaccinated.  Zoster vaccine. You may need this after age 5.  Measles, mumps, and rubella (MMR) vaccine. You may need at least one dose of MMR if you were born in  1957 or later. You may also need a second dose.  Pneumococcal 13-valent conjugate (PCV13) vaccine. You may need this if you have certain conditions and were not previously vaccinated.  Pneumococcal polysaccharide (PPSV23) vaccine. You may need one or two doses if you smoke cigarettes or if you have certain conditions.  Meningococcal vaccine. You may need this if you have certain  conditions.  Hepatitis A vaccine. You may need this if you have certain conditions or if you travel or work in places where you may be exposed to hepatitis A.  Hepatitis B vaccine. You may need this if you have certain conditions or if you travel or work in places where you may be exposed to hepatitis B.  Haemophilus influenzae type b (Hib) vaccine. You may need this if you have certain conditions.  Talk to your health care provider about which screenings and vaccines you need and how often you need them. This information is not intended to replace advice given to you by your health care provider. Make sure you discuss any questions you have with your health care provider. Document Released: 06/09/2015 Document Revised: 01/31/2016 Document Reviewed: 03/14/2015 Elsevier Interactive Patient Education  2017 [employer].

## 2017-03-29 LAB — CMP14+LP+TP+TSH+CBC/PLT
A/G RATIO: 2 (ref 1.2–2.2)
ALK PHOS: 52 IU/L (ref 39–117)
ALT: 15 IU/L (ref 0–32)
AST: 21 IU/L (ref 0–40)
Albumin: 4.3 g/dL (ref 3.5–5.5)
BUN/Creatinine Ratio: 21 (ref 9–23)
BUN: 13 mg/dL (ref 6–24)
Bilirubin Total: 0.3 mg/dL (ref 0.0–1.2)
CHOLESTEROL TOTAL: 183 mg/dL (ref 100–199)
CO2: 21 mmol/L (ref 20–29)
Calcium: 9.1 mg/dL (ref 8.7–10.2)
Chloride: 106 mmol/L (ref 96–106)
Creatinine, Ser: 0.61 mg/dL (ref 0.57–1.00)
Free Thyroxine Index: 2.1 (ref 1.2–4.9)
GFR calc Af Amer: 126 mL/min/{1.73_m2} (ref >59)
GFR calc non Af Amer: 109 mL/min/{1.73_m2} (ref >59)
GLOBULIN, TOTAL: 2.2 g/dL (ref 1.5–4.5)
Glucose: 81 mg/dL (ref 65–99)
HDL: 42 mg/dL (ref >39)
Hematocrit: 38.9 % (ref 34.0–46.6)
Hemoglobin: 13 g/dL (ref 11.1–15.9)
LDL CALC: 115 mg/dL — AB (ref 0–99)
LDL/HDL RATIO: 2.7 ratio (ref 0.0–3.2)
MCH: 30.5 pg (ref 26.6–33.0)
MCHC: 33.4 g/dL (ref 31.5–35.7)
MCV: 91 fL (ref 79–97)
PLATELETS: 306 10*3/uL (ref 150–379)
Potassium: 4.3 mmol/L (ref 3.5–5.2)
RBC: 4.26 x10E6/uL (ref 3.77–5.28)
RDW: 13 % (ref 12.3–15.4)
Sodium: 141 mmol/L (ref 134–144)
T3 UPTAKE RATIO: 26 % (ref 24–39)
T4 TOTAL: 7.9 ug/dL (ref 4.5–12.0)
TSH: 1.46 u[IU]/mL (ref 0.450–4.500)
Total Protein: 6.5 g/dL (ref 6.0–8.5)
Triglycerides: 128 mg/dL (ref 0–149)
VLDL Cholesterol Cal: 26 mg/dL (ref 5–40)
WBC: 6.3 10*3/uL (ref 3.4–10.8)

## 2017-03-31 LAB — PAPIG, HPV, RFX 16/18
HPV, HIGH-RISK: NEGATIVE
PAP Smear Comment: 0

## 2017-04-01 ENCOUNTER — Encounter: Payer: Self-pay | Admitting: Obstetrics and Gynecology

## 2017-04-10 NOTE — Progress Notes (Signed)
Gynecology Ultrasound Follow Up  Chief Complaint:  Chief Complaint  Patient presents with  . Follow-up    GYN U/S     History of Present Illness: Patient is a 46 y.o. female who presents today for ultrasound evaluation of abnormal uterine bleeding.  Ultrasound demonstrates the following findgins Adnexa: no masses seen normal consistency Uterus: Non-enalrged with 58mm endometrial stripe with thickening at the lower uteirne segment, fluid in the lower uterine segment and calcified polyp vs fibroid. Additional: no free fluid  Review of Systems: Review of Systems  Constitutional: Negative for chills, fever and weight loss.  Gastrointestinal: Negative for abdominal pain.  Neurological: Negative for dizziness.    Past Medical History:  Past Medical History:  Diagnosis Date  . Anemia    WITH PREGNANCY ONLY  . Family history of adverse reaction to anesthesia    MOM-N/V  . Heart murmur    AS A CHILD  . Hematuria, microscopic   . Ovarian cyst   . Pyelonephritis     Past Surgical History:  Past Surgical History:  Procedure Laterality Date  . BREAST BIOPSY Right 10-28-13   FOCUS OF ATYPICAL DUCTAL HYPERPLASIA, 1 MM.  Marland Kitchen BREAST SURGERY Right 01-07-14   1 mm foci of atypical ductal hyperplasia, Gail model risk 3.8%, 5 years; 34.5% lifetime.  Marland Kitchen HERNIA REPAIR UMBILICAL ADULT N/A 0/05/930   Performed by Robert Bellow, MD at [facility] ORS  . TUBAL LIGATION  01/14/96    Gynecologic History:  Patient's last menstrual period was 04/03/2017. Contraception: tubal ligation Last Pap: 03/28/17 Results were: .NIL HPV negative  Family History:  Family History  Problem Relation Age of Onset  . Breast cancer Mother 29  . Healthy Father   . Heart failure Paternal Grandfather 74    Social History:  Social History   Socioeconomic History  . Marital status: Single    Spouse name: Not on file  . Number of children: Not on file  . Years of education: Not on file  . Highest education  level: Not on file  Social Needs  . Financial resource strain: Not on file  . Food insecurity - worry: Not on file  . Food insecurity - inability: Not on file  . Transportation needs - medical: Not on file  . Transportation needs - non-medical: Not on file  Occupational History  . Not on file  Tobacco Use  . Smoking status: Current Every Day Smoker    Packs/day: 0.50    Years: 11.00    Pack years: 5.50    Types: Cigarettes    Start date: 01/16/1997  . Smokeless tobacco: Current User  Substance and Sexual Activity  . Alcohol use: No    Alcohol/week: 0.0 oz  . Drug use: No  . Sexual activity: Yes    Birth control/protection: None  Other Topics Concern  . Not on file  Social History Narrative  . Not on file    Allergies:  Allergies  Allergen Reactions  . Penicillins Anaphylaxis, Hives, Shortness Of Breath and Swelling    Has patient had a PCN reaction causing immediate rash, facial/tongue/throat swelling, SOB or lightheadedness with hypotension: Yes Has patient had a PCN reaction causing severe rash involving mucus membranes or skin necrosis: Yes Has patient had a PCN reaction that required hospitalization:Treated in ER Has patient had a PCN reaction occurring within the last 10 years: No If all of the above answers are "NO", then may proceed with Cephalosporin use. Childhood allergy  Medications: Prior to Admission medications   Medication Sig Start Date End Date Taking? Authorizing Provider  Multiple Vitamin (MULTIVITAMIN WITH MINERALS) TABS tablet Take 1 tablet by mouth daily. Women's Multivitamin    [provider]  raloxifene (EVISTA) 60 MG tablet Take 1 tablet (60 mg total) by mouth daily. 01/02/17   Robert Bellow, MD    Physical Exam Vitals: Last menstrual period 03/14/2017.  General: NAD HEENT: normocephalic, anicteric Pulmonary: No increased work of breathing Extremities: no edema, erythema, or tenderness Neurologic: Grossly intact, normal  gait Psychiatric: mood appropriate, affect full   Assessment: 46 y.o. [id number] No problem-specific Assessment & Plan notes found for this encounter.   Plan: Problem List Items Addressed This Visit    None    Visit Diagnoses    Abnormal uterine bleeding    -  Primary   Encounter for surgical counseling       Uterine polyp          1) Post hysteroscopy D&C lower uterine segment polyp vs fibroid.  Cervical cytology normal 03/28/2017 so no concern for cervical malignancy.    2) I have discussed with the patient the indications for the procedure. Included in the discussion were the options of therapy, as wall as their individual risks, benefits, and complications. Ample time was given to answer all questions.   In office pipelle biopsy generally provides comparable results to [facility], however this sampling modality may miss focal lesions if these were previously documented on ultrasound.  It is because of the potential to miss focal lesions that hysteroscopy D&C is also warranted in patient with continued postmenopausal bleeding that is not self limited regardless of prior in office biopsy results or ultrasound findings.  She understands that the risk of continued observation include worsening bleeding or worsening of any underlying pathology.  The choices include: 1. Doing nothing but following her symptoms 2. Attempts at hormonal manipulation with either BCP or Depo-Provera for premenopausal patients with no concern for focal lesion or endometrial pathology 3. D&C/hysteroscopy. 4. Endometrial ablation via Novasure or other techniques for premenopausal patients with no concern for focal lesion or endometrial pathology  5. As final resort, hysterectomy. After consideration of her history and findings, mutual decision has been made to proceed with D+C/hysteroscopy. While the incidence is low, the risks from this surgery include, but are not limited to, the risks of anesthesia, hemorrhage, infection,  perforation, and injury to adjacent structures including bowel, bladder and blood vessels.   3) A total of 15 minutes were spent in face-to-face contact with the patient during this encounter with over half of that time devoted to counseling and coordination of care.

## 2017-04-11 ENCOUNTER — Encounter: Payer: Self-pay | Admitting: Obstetrics and Gynecology

## 2017-04-11 ENCOUNTER — Ambulatory Visit (INDEPENDENT_AMBULATORY_CARE_PROVIDER_SITE_OTHER): Payer: 59

## 2017-04-11 ENCOUNTER — Ambulatory Visit (INDEPENDENT_AMBULATORY_CARE_PROVIDER_SITE_OTHER): Payer: 59 | Admitting: Obstetrics and Gynecology

## 2017-04-11 VITALS — BP 144/78 | HR 90 | Ht 62.0 in | Wt 159.0 lb

## 2017-04-11 DIAGNOSIS — N840 Polyp of corpus uteri: Secondary | ICD-10-CM | POA: Diagnosis not present

## 2017-04-11 DIAGNOSIS — Z7189 Other specified counseling: Secondary | ICD-10-CM | POA: Diagnosis not present

## 2017-04-11 DIAGNOSIS — N939 Abnormal uterine and vaginal bleeding, unspecified: Secondary | ICD-10-CM

## 2017-04-14 ENCOUNTER — Telehealth: Payer: Self-pay

## 2017-04-14 NOTE — Telephone Encounter (Signed)
Pt saw AMS Friday, was told to wait for the nurse to call her.  She wants to know what will her options be as far as procedures and surgery wise?  She doesn't want surgery after surgery.  [phone].

## 2017-04-14 NOTE — Telephone Encounter (Signed)
Madison Adams, can you help with this?

## 2017-04-14 NOTE — Telephone Encounter (Signed)
Lmtrc

## 2017-04-15 ENCOUNTER — Telehealth: Payer: Self-pay | Admitting: Obstetrics and Gynecology

## 2017-04-15 NOTE — Telephone Encounter (Signed)
-----   Message from Malachy Mood, MD sent at 04/11/2017 10:54 PM EST ----- Regarding: Surgery scheduling Surgery Date:   LOS: outpatient  Surgery Booking Request Patient Full Name: Madison Adams MRN: [id number]  DOB: 1970-07-10  Surgeon: Malachy Mood, MD  Requested Surgery Date and Time: 2-4 weeks Primary Diagnosis and Code: Abnormal uterine bleeding Secondary Diagnosis and Code: Uterine polyp Surgical Procedure: Hysteroscopy, D&C L&D Notification:N/A Admission Status: same day surgery Length of Surgery: 1h Special Case Needs: none H&P: day of fine (date) Phone Interview or Office Pre-Admit: either Interpreter: none Language: English Medical Clearance: none Special Scheduling Instructions: none

## 2017-04-15 NOTE — Telephone Encounter (Signed)
Patient is aware of H&P and consents on day of surgery, Pre-admit Testing phone interview to be scheduled, and OR on 05/02/17.

## 2017-04-15 NOTE — Telephone Encounter (Signed)
When we have a localized finding like the polyp on her ultrasound we have to do the hysteroscopy D&C first to know what we're dealing with and rule out any pathology which would impact the type of hysterectomy and whether we leave or take ovaries.  Hysterectomy and ablation are options pending results of the hysteroscopy D&C

## 2017-04-25 ENCOUNTER — Encounter
Admission: RE | Admit: 2017-04-25 | Discharge: 2017-04-25 | Disposition: A | Discharge status: Home / Self Care | Payer: 59 | Source: Ambulatory Visit | Attending: Obstetrics and Gynecology | Admitting: Obstetrics and Gynecology

## 2017-04-25 ENCOUNTER — Other Ambulatory Visit: Payer: Self-pay

## 2017-04-25 HISTORY — DX: Adverse effect of unspecified anesthetic, initial encounter: T41.45XA

## 2017-04-25 NOTE — Patient Instructions (Signed)
  Your procedure is scheduled on: 05-02-17  Report to Same Day Surgery 2nd floor medical mall [facility] Entrance-take elevator on left to 2nd floor.  Check in with surgery information desk.) To find out your arrival time please call [phone] between 1PM - 3PM on 05-01-17  Remember: Instructions that are not followed completely may result in serious medical risk, up to and including death, or upon the discretion of your surgeon and anesthesiologist your surgery may need to be rescheduled.    _x___ 1. Do not eat food after midnight the night before your procedure. You may drink clear liquids up to 2 hours before you are scheduled to arrive at the hospital for your procedure.  Do not drink clear liquids within 2 hours of your scheduled arrival to the hospital.  Clear liquids include  --Water or Apple juice without pulp  --Clear carbohydrate beverage such as ClearFast or Gatorade  --Black Coffee or Clear Tea (No milk, no creamers, do not add anything to the coffee or Tea   No gum chewing or hard candies.     __x__ 2. No Alcohol for 24 hours before or after surgery.   __x__3. No Smoking for 24 prior to surgery.   ____  4. Bring all medications with you on the day of surgery if instructed.    __x__ 5. Notify your doctor if there is any change in your medical condition     (cold, fever, infections).     Do not wear jewelry, make-up, hairpins, clips or nail polish.  Do not wear lotions, powders, or perfumes. You may wear deodorant.  Do not shave 48 hours prior to surgery. Men may shave face and neck.  Do not bring valuables to the hospital.    [facility] is not responsible for any belongings or valuables.               Contacts, dentures or bridgework may not be worn into surgery.  Leave your suitcase in the car. After surgery it may be brought to your room.  For patients admitted to the hospital, discharge time is determined by your treatment team.   Patients discharged the day of  surgery will not be allowed to drive home.  You will need someone to drive you home and stay with you the night of your procedure.     ____ Take anti-hypertensive listed below, cardiac, seizure, asthma, anti-reflux and psychiatric medicines. These include:  1. NONE  2.  3.  4.  5.  6.  ____Fleets enema or Magnesium Citrate as directed.   ____ Use CHG Soap or sage wipes as directed on instruction sheet   ____ Use inhalers on the day of surgery and bring to hospital day of surgery  ____ Stop Metformin and Janumet 2 days prior to surgery.    ____ Take 1/2 of usual insulin dose the night before surgery and none on the morning     surgery.   ____ Follow recommendations from Cardiologist, Pulmonologist or PCP regarding stopping Aspirin, Coumadin, Plavix ,Eliquis, Effient, or Pradaxa, and Pletal.  X____Stop Anti-inflammatories such as Advil, Aleve, IBUPROFEN, Motrin, Naproxen, Naprosyn, Goodies powders or aspirin products NOW-OK to take Tylenol   ____ Stop supplements until after surgery.   ____ Bring C-Pap to the hospital.

## 2017-05-02 ENCOUNTER — Ambulatory Visit
Admission: RE | Admit: 2017-05-02 | Discharge: 2017-05-02 | Disposition: A | Discharge status: Home / Self Care | Payer: 59 | Source: Ambulatory Visit | Attending: Obstetrics and Gynecology | Admitting: Obstetrics and Gynecology

## 2017-05-02 ENCOUNTER — Ambulatory Visit: Payer: 59 | Admitting: Anesthesiology

## 2017-05-02 ENCOUNTER — Encounter
Admission: RE | Disposition: A | Discharge status: Home / Self Care | Payer: Self-pay | Source: Ambulatory Visit | Attending: Obstetrics and Gynecology

## 2017-05-02 ENCOUNTER — Encounter: Payer: Self-pay | Admitting: Anesthesiology

## 2017-05-02 DIAGNOSIS — Z9889 Other specified postprocedural states: Secondary | ICD-10-CM

## 2017-05-02 DIAGNOSIS — N946 Dysmenorrhea, unspecified: Secondary | ICD-10-CM | POA: Diagnosis not present

## 2017-05-02 DIAGNOSIS — N920 Excessive and frequent menstruation with regular cycle: Secondary | ICD-10-CM | POA: Diagnosis not present

## 2017-05-02 DIAGNOSIS — F17200 Nicotine dependence, unspecified, uncomplicated: Secondary | ICD-10-CM | POA: Insufficient documentation

## 2017-05-02 DIAGNOSIS — R9389 Abnormal findings on diagnostic imaging of other specified body structures: Secondary | ICD-10-CM | POA: Diagnosis present

## 2017-05-02 DIAGNOSIS — N840 Polyp of corpus uteri: Secondary | ICD-10-CM | POA: Insufficient documentation

## 2017-05-02 HISTORY — PX: HYSTEROSCOPY WITH D & C: SHX1775

## 2017-05-02 LAB — POCT PREGNANCY, URINE: Preg Test, Ur: NEGATIVE

## 2017-05-02 SURGERY — DILATATION AND CURETTAGE /HYSTEROSCOPY
Anesthesia: General | Site: Vagina | Wound class: Clean Contaminated

## 2017-05-02 MED ORDER — MIDAZOLAM HCL 2 MG/2ML IJ SOLN
INTRAMUSCULAR | Status: DC | PRN
  Administered 2017-05-02: 2 mg via INTRAVENOUS

## 2017-05-02 MED ORDER — FAMOTIDINE 20 MG PO TABS
20.0000 mg | ORAL_TABLET | Freq: Once | ORAL | Status: AC
  Administered 2017-05-02: 20 mg via ORAL

## 2017-05-02 MED ORDER — KETOROLAC TROMETHAMINE 30 MG/ML IJ SOLN
INTRAMUSCULAR | Status: DC | PRN
  Administered 2017-05-02: 30 mg via INTRAVENOUS

## 2017-05-02 MED ORDER — MIDAZOLAM HCL 2 MG/2ML IJ SOLN
INTRAMUSCULAR | Status: AC
  Filled 2017-05-02: qty 2

## 2017-05-02 MED ORDER — IBUPROFEN 600 MG PO TABS: 600.0000 mg | ORAL_TABLET | Freq: Four times a day (QID) | ORAL | 0 refills | Status: AC | PRN

## 2017-05-02 MED ORDER — FAMOTIDINE 20 MG PO TABS
ORAL_TABLET | ORAL | Status: AC
  Filled 2017-05-02: qty 1

## 2017-05-02 MED ORDER — LIDOCAINE HCL (PF) 2 % IJ SOLN
INTRAMUSCULAR | Status: AC
  Filled 2017-05-02: qty 10

## 2017-05-02 MED ORDER — GLYCOPYRROLATE 0.2 MG/ML IJ SOLN
INTRAMUSCULAR | Status: AC
Start: 2017-05-02 — End: ?
  Filled 2017-05-02: qty 1

## 2017-05-02 MED ORDER — PROPOFOL 10 MG/ML IV BOLUS
INTRAVENOUS | Status: AC
  Filled 2017-05-02: qty 20

## 2017-05-02 MED ORDER — FENTANYL CITRATE (PF) 100 MCG/2ML IJ SOLN
INTRAMUSCULAR | Status: DC | PRN
  Administered 2017-05-02: 25 ug via INTRAVENOUS
  Administered 2017-05-02: 50 ug via INTRAVENOUS
  Administered 2017-05-02: 25 ug via INTRAVENOUS

## 2017-05-02 MED ORDER — ACETAMINOPHEN 325 MG PO TABS
ORAL_TABLET | ORAL | Status: AC
  Administered 2017-05-02: 650 mg via ORAL
  Filled 2017-05-02: qty 2

## 2017-05-02 MED ORDER — FENTANYL CITRATE (PF) 100 MCG/2ML IJ SOLN
INTRAMUSCULAR | Status: AC
  Filled 2017-05-02: qty 2

## 2017-05-02 MED ORDER — DEXAMETHASONE SODIUM PHOSPHATE 10 MG/ML IJ SOLN
INTRAMUSCULAR | Status: DC | PRN
  Administered 2017-05-02: 10 mg via INTRAVENOUS

## 2017-05-02 MED ORDER — LACTATED RINGERS IV SOLN
INTRAVENOUS | Status: DC | PRN
  Administered 2017-05-02: 11:00:00 via INTRAVENOUS

## 2017-05-02 MED ORDER — MEPERIDINE HCL 50 MG/ML IJ SOLN: 6.2500 mg | INTRAMUSCULAR | Status: DC | PRN

## 2017-05-02 MED ORDER — ACETAMINOPHEN 325 MG PO TABS
650.0000 mg | ORAL_TABLET | Freq: Once | ORAL | Status: AC
  Administered 2017-05-02: 650 mg via ORAL

## 2017-05-02 MED ORDER — FENTANYL CITRATE (PF) 100 MCG/2ML IJ SOLN
INTRAMUSCULAR | Status: AC
  Administered 2017-05-02: 25 ug via INTRAVENOUS
  Filled 2017-05-02: qty 2

## 2017-05-02 MED ORDER — OXYCODONE HCL 5 MG PO TABS
5.0000 mg | ORAL_TABLET | Freq: Once | ORAL | Status: AC | PRN
  Administered 2017-05-02: 5 mg via ORAL

## 2017-05-02 MED ORDER — LACTATED RINGERS IV SOLN
INTRAVENOUS | Status: DC
  Administered 2017-05-02: 11:00:00 via INTRAVENOUS

## 2017-05-02 MED ORDER — ONDANSETRON HCL 4 MG/2ML IJ SOLN
INTRAMUSCULAR | Status: DC | PRN
  Administered 2017-05-02: 4 mg via INTRAVENOUS

## 2017-05-02 MED ORDER — LIDOCAINE HCL (CARDIAC) 20 MG/ML IV SOLN
INTRAVENOUS | Status: DC | PRN
  Administered 2017-05-02: 50 mg via INTRAVENOUS

## 2017-05-02 MED ORDER — OXYCODONE HCL 5 MG PO TABS
ORAL_TABLET | ORAL | Status: AC
  Filled 2017-05-02: qty 1

## 2017-05-02 MED ORDER — PROMETHAZINE HCL 25 MG/ML IJ SOLN: 6.2500 mg | INTRAMUSCULAR | Status: DC | PRN

## 2017-05-02 MED ORDER — OXYCODONE HCL 5 MG/5ML PO SOLN: 5.0000 mg | Freq: Once | ORAL | Status: AC | PRN

## 2017-05-02 MED ORDER — HYDROCODONE-ACETAMINOPHEN 5-325 MG PO TABS: 1.0000 | ORAL_TABLET | Freq: Four times a day (QID) | ORAL | 0 refills | Status: DC | PRN

## 2017-05-02 MED ORDER — FENTANYL CITRATE (PF) 100 MCG/2ML IJ SOLN
25.0000 ug | INTRAMUSCULAR | Status: DC | PRN
  Administered 2017-05-02: 25 ug via INTRAVENOUS

## 2017-05-02 MED ORDER — PROPOFOL 10 MG/ML IV BOLUS
INTRAVENOUS | Status: DC | PRN
  Administered 2017-05-02: 150 mg via INTRAVENOUS

## 2017-05-02 SURGICAL SUPPLY — 17 items
CATH ROBINSON RED A/P 16FR (CATHETERS) ×2 IMPLANT
ELECT REM PT RETURN 9FT ADLT (ELECTROSURGICAL) ×2
ELECTRODE REM PT RTRN 9FT ADLT (ELECTROSURGICAL) ×1 IMPLANT
GLOVE BIO SURGEON STRL SZ7 (GLOVE) ×2 IMPLANT
GLOVE INDICATOR 7.5 STRL GRN (GLOVE) ×2 IMPLANT
GOWN STRL REUS W/ TWL LRG LVL3 (GOWN DISPOSABLE) ×2 IMPLANT
GOWN STRL REUS W/TWL LRG LVL3 (GOWN DISPOSABLE) ×4
IV LACTATED RINGERS 1000ML (IV SOLUTION) ×2 IMPLANT
KIT RM TURNOVER CYSTO AR (KITS) ×2 IMPLANT
MYOSURE LITE POLYP REMOVAL (MISCELLANEOUS) ×1 IMPLANT
PACK DNC HYST (MISCELLANEOUS) ×2 IMPLANT
PAD OB MATERNITY 4.3X12.25 (PERSONAL CARE ITEMS) ×2 IMPLANT
PAD PREP 24X41 OB/GYN DISP (PERSONAL CARE ITEMS) ×2 IMPLANT
SUT VIC AB 2-0 SH 27 (SUTURE) ×2
SUT VIC AB 2-0 SH 27XBRD (SUTURE) IMPLANT
TOWEL OR 17X26 4PK STRL BLUE (TOWEL DISPOSABLE) ×2 IMPLANT
TUBING CONNECTING 10 (TUBING) ×2 IMPLANT

## 2017-05-02 NOTE — Anesthesia Preprocedure Evaluation (Addendum)
Anesthesia Evaluation  Patient identified by MRN, date of birth, ID band Patient awake    Reviewed: Allergy & Precautions, NPO status , Patient's Chart, lab work & pertinent test results  History of Anesthesia Complications Negative for: history of anesthetic complications (slow to wake up after breast surgery)  Airway Mallampati: II  TM Distance: >3 FB Neck ROM: Full    Dental no notable dental hx.    Pulmonary neg sleep apnea, neg COPD, Current Smoker,    breath sounds clear to auscultation- rhonchi (-) wheezing      Cardiovascular Exercise Tolerance: Good (-) hypertension(-) CAD, (-) Past MI and (-) Cardiac Stents  Rhythm:Regular Rate:Normal - Systolic murmurs and - Diastolic murmurs    Neuro/Psych negative neurological ROS  negative psych ROS   GI/Hepatic negative GI ROS, Neg liver ROS,   Endo/Other  negative endocrine ROSneg diabetes  Renal/GU negative Renal ROS     Musculoskeletal negative musculoskeletal ROS (+)   Abdominal (+) - obese,   Peds  Hematology  (+) anemia ,   Anesthesia Other Findings Past Medical History: No date: Anemia     Comment:  WITH PREGNANCY ONLY No date: Complication of anesthesia     Comment:  HARD TIME WAKING UP FOR 1ST BREAST SURGERY No date: Family history of adverse reaction to anesthesia     Comment:  MOM-N/V No date: Heart murmur     Comment:  AS A CHILD No date: Hematuria, microscopic No date: Ovarian cyst No date: Pyelonephritis   Reproductive/Obstetrics                            Anesthesia Physical Anesthesia Plan  ASA: II  Anesthesia Plan: General   Post-op Pain Management:    Induction: Intravenous  PONV Risk Score and Plan: 1 and Dexamethasone and Ondansetron  Airway Management Planned: LMA  Additional Equipment:   Intra-op Plan:   Post-operative Plan:   Informed Consent: I have reviewed the patients History and Physical,  chart, labs and discussed the procedure including the risks, benefits and alternatives for the proposed anesthesia with the patient or authorized representative who has indicated his/her understanding and acceptance.   Dental advisory given  Plan Discussed with: CRNA and Anesthesiologist  Anesthesia Plan Comments:        Anesthesia Quick Evaluation

## 2017-05-02 NOTE — Transfer of Care (Signed)
Immediate Anesthesia Transfer of Care Note  Patient: Madison Adams  Procedure(s) Performed: DILATATION AND CURETTAGE /HYSTEROSCOPY (N/A Vagina )  Patient Location: PACU  Anesthesia Type:General  Level of Consciousness: drowsy and patient cooperative  Airway & Oxygen Therapy: Patient Spontanous Breathing and Patient connected to face mask oxygen  Post-op Assessment: Report given to RN and Post -op Vital signs reviewed and stable  Post vital signs: Reviewed and stable  Last Vitals:  Vitals:   05/02/17 1041 05/02/17 1255  BP: 130/83 (!) 127/93  Pulse: 92 63  Resp: 17 16  Temp: 36.5 C (!) 36.3 C  SpO2: 99% 100%    Last Pain:  Vitals:   05/02/17 1255  TempSrc:   PainSc: 0-No pain         Complications: No apparent anesthesia complications

## 2017-05-02 NOTE — Anesthesia Postprocedure Evaluation (Signed)
Anesthesia Post Note  Patient: Madison Adams  Procedure(s) Performed: DILATATION AND CURETTAGE /HYSTEROSCOPY (N/A Vagina )  Patient location during evaluation: PACU Anesthesia Type: General Level of consciousness: awake and alert and oriented Pain management: pain level controlled Vital Signs Assessment: post-procedure vital signs reviewed and stable Respiratory status: spontaneous breathing, nonlabored ventilation and respiratory function stable Cardiovascular status: blood pressure returned to baseline and stable Postop Assessment: no signs of nausea or vomiting Anesthetic complications: no     Last Vitals:  Vitals:   05/02/17 1325 05/02/17 1340  BP: (!) 146/91 125/86  Pulse: 73 75  Resp: 20 19  Temp:  36.4 C  SpO2: 100% 100%    Last Pain:  Vitals:   05/02/17 1340  TempSrc:   PainSc: 4                  Bridie Colquhoun

## 2017-05-02 NOTE — Anesthesia Procedure Notes (Signed)
Procedure Name: LMA Insertion Date/Time: 05/02/2017 12:09 PM Performed by: Jonna Clark, CRNA Pre-anesthesia Checklist: Patient identified, Patient being monitored, Timeout performed, Emergency Drugs available and Suction available Patient Re-evaluated:Patient Re-evaluated prior to induction Oxygen Delivery Method: Circle system utilized Preoxygenation: Pre-oxygenation with 100% oxygen Induction Type: IV induction Ventilation: Mask ventilation without difficulty LMA: LMA inserted LMA Size: 3.5 Tube type: Oral Number of attempts: 1 Placement Confirmation: positive ETCO2 and breath sounds checked- equal and bilateral Tube secured with: Tape Dental Injury: Teeth and Oropharynx as per pre-operative assessment

## 2017-05-02 NOTE — H&P (Signed)
Date of Initial H&P: 04/11/2017  History reviewed, patient examined, no change in status, stable for surgery.

## 2017-05-02 NOTE — Anesthesia Post-op Follow-up Note (Signed)
Anesthesia QCDR form completed.        

## 2017-05-02 NOTE — Discharge Instructions (Signed)
Dilation and Curettage or Vacuum Curettage, Care After These instructions give you information about caring for yourself after your procedure. Your doctor may also give you more specific instructions. Call your doctor if you have any problems or questions after your procedure. Follow these instructions at home: Activity  Do not drive or use heavy machinery while taking prescription pain medicine.  For 24 hours after your procedure, avoid driving.  Take short walks often, followed by rest periods. Ask your doctor what activities are safe for you. After one or two days, you may be able to return to your normal activities.  Do not lift anything that is heavier than 10 lb (4.5 kg) until your doctor approves.  For at least 2 weeks, or as long as told by your doctor: ? Do not douche. ? Do not use tampons. ? Do not have sex. General instructions  Take over-the-counter and prescription medicines only as told by your doctor. This is very important if you take blood thinning medicine.  Do not take baths, swim, or use a hot tub until your doctor approves. Take showers instead of baths.  Wear compression stockings as told by your doctor.  It is up to you to get the results of your procedure. Ask your doctor when your results will be ready.  Keep all follow-up visits as told by your doctor. This is important. Contact a doctor if:  You have very bad cramps that get worse or do not get better with medicine.  You have very bad pain in your belly (abdomen).  You cannot drink fluids without throwing up (vomiting).  You get pain in a different part of the area between your belly and thighs (pelvis).  You have bad-smelling discharge from your vagina.  You have a rash. Get help right away if:  You are bleeding a lot from your vagina. A lot of bleeding means soaking more than one sanitary pad in an hour, for 2 hours in a row.  You have clumps of blood (blood clots) coming from your  vagina.  You have a fever or chills.  Your belly feels very tender or hard.  You have chest pain.  You have trouble breathing.  You cough up blood.  You feel dizzy.  You feel light-headed.  You pass out (faint).  You have pain in your neck or shoulder area. Summary  Take short walks often, followed by rest periods. Ask your doctor what activities are safe for you. After one or two days, you may be able to return to your normal activities.  Do not lift anything that is heavier than 10 lb (4.5 kg) until your doctor approves.  Do not take baths, swim, or use a hot tub until your doctor approves. Take showers instead of baths.  Contact your doctor if you have any symptoms of infection, like bad-smelling discharge from your vagina. This information is not intended to replace advice given to you by your health care provider. Make sure you discuss any questions you have with your health care provider. Document Released: 02/20/2008 Document Revised: 01/29/2016 Document Reviewed: 01/29/2016 Elsevier Interactive Patient Education  2017 [place] Anesthesia, Adult, Care After These instructions provide you with information about caring for yourself after your procedure. Your health care provider may also give you more specific instructions. Your treatment has been planned according to current medical practices, but problems sometimes occur. Call your health care provider if you have any problems or questions after your procedure. What  can I expect after the procedure? After the procedure, it is common to have:  Vomiting.  A sore throat.  Mental slowness.  It is common to feel:  Nauseous.  Cold or shivery.  Sleepy.  Tired.  Sore or achy, even in parts of your body where you did not have surgery.  Follow these instructions at home: For at least 24 hours after the procedure:  Do not: ? Participate in activities where you could fall or become  injured. ? Drive. ? Use heavy machinery. ? Drink alcohol. ? Take sleeping pills or medicines that cause drowsiness. ? Make important decisions or sign legal documents. ? Take care of children on your own.  Rest. Eating and drinking  If you vomit, drink water, juice, or soup when you can drink without vomiting.  Drink enough fluid to keep your urine clear or pale yellow.  Make sure you have little or no nausea before eating solid foods.  Follow the diet recommended by your health care provider. General instructions  Have a responsible adult stay with you until you are awake and alert.  Return to your normal activities as told by your health care provider. Ask your health care provider what activities are safe for you.  Take over-the-counter and prescription medicines only as told by your health care provider.  If you smoke, do not smoke without supervision.  Keep all follow-up visits as told by your health care provider. This is important. Contact a health care provider if:  You continue to have nausea or vomiting at home, and medicines are not helpful.  You cannot drink fluids or start eating again.  You cannot urinate after 8-12 hours.  You develop a skin rash.  You have fever.  You have increasing redness at the site of your procedure. Get help right away if:  You have difficulty breathing.  You have chest pain.  You have unexpected bleeding.  You feel that you are having a life-threatening or urgent problem. This information is not intended to replace advice given to you by your health care provider. Make sure you discuss any questions you have with your health care provider. Document Released: 08/19/2000 Document Revised: 10/16/2015 Document Reviewed: 04/27/2015 Elsevier Interactive Patient Education  [employer].

## 2017-05-02 NOTE — Op Note (Signed)
Patient Name: Madison Adams Date of Procedure: @TODAY @  Preoperative Diagnosis: 1) 46 y.o. with menorrhagia and dysmenorrhea 2) Uterine polyp  Postoperative Diagnosis: 1) 46 y.o. with menorrhagia and dysmenorrhea 2) Uterine polyp  Operation Performed: Hysteroscopy, dilation and curettage  Indication: Office work up for abnormal uterine bleeding revealed lower uterine segment endometrial polyp  Anesthesia: General  Primary Surgeon: Malachy Mood, MD  Assistant: none  Preoperative Antibiotics: none  Estimated Blood Loss: minimal IV Fluids: 464mL  Urine Output:: ~56mL straight cath  Drains or Tubes: none  Implants: none  Specimens Removed: endometrial curettings and polyp  Complications: none  Intraoperative Findings:  Normal cervix and vagina.  Small anterior lower uterine segment polyp, small polypoid fragment of endometrium in the right cornua  Patient Condition: stable  Procedure in Detail:  Patient was taken to the operating room were she was administered general endotracheal anesthesia.  She was positioned in the dorsal lithotomy position utilizing Allen stirups, prepped and draped in the usual sterile fashion.  Uterus was noted to be non-enlarged in size, anteverted.   Prior to proceeding with the case a time out was performed.  Attention was turned to the patient's pelvis.  A red rubber catheter was used to empty the patient's bladder.  An operative speculum was placed to allow visualization of the cervix.  The anterior lip of the cervix was grasped with a single tooth tenaculum and the cervix was sequentially dilated using pratt dilators.  The hysteroscope was then advanced into the uterine cavity noting the above findings.  Targeted curettage was performed using the Myosure system and the resulting specimen collected and sent to pathology.    The single tooth tenaculum was removed from the cervix.  The tenaculum sites and cervix were noted to be hemostatic on  the right with some bleeding noted on the left, figure of eight stitch of 0 Vicryl was placed to achieve hemostasis on the left tenaculum site before removing the operative speculum.  Sponge needle and instrument counts were corrects times two.  The patient tolerated the procedure well and was taken to the recovery room in stable condition.

## 2017-05-05 LAB — SURGICAL PATHOLOGY

## 2017-05-07 ENCOUNTER — Ambulatory Visit (INDEPENDENT_AMBULATORY_CARE_PROVIDER_SITE_OTHER): Payer: 59 | Admitting: Obstetrics and Gynecology

## 2017-05-07 ENCOUNTER — Encounter: Payer: Self-pay | Admitting: Obstetrics and Gynecology

## 2017-05-07 VITALS — BP 130/82 | HR 97 | Temp 97.5°F | Ht 62.0 in | Wt 162.0 lb

## 2017-05-07 DIAGNOSIS — Z4889 Encounter for other specified surgical aftercare: Secondary | ICD-10-CM

## 2017-05-07 MED ORDER — DOXYCYCLINE HYCLATE 100 MG PO CAPS: 100.0000 mg | ORAL_CAPSULE | Freq: Two times a day (BID) | ORAL | 0 refills | Status: AC

## 2017-05-07 NOTE — Progress Notes (Signed)
      Postoperative Follow-up Patient presents post op from hysteroscopy, D&C 1weeks ago for abnormal uterine bleeding.  Subjective: Patient reports some improvement in her preop symptoms. Eating a regular diet without difficulty. The patient is not having any pain.  Activity: normal activities of daily living.  She is still having some moderate cramping and inquires about risk of infection following procedure  Objective: Blood pressure 130/82, pulse 97, temperature (!) 97.5 F (36.4 C), height 5\' 2"  (1.575 m), weight 162 lb (73.5 kg), last menstrual period 05/01/2017.  Gen: NAD HEENT: normocephalic, anicteric Pulmonary: no increased work of breathing Abdomen: soft, non-tender, non-distended GU: deferred Extremities: no edema, or erythema  Pathology: A. ENDOMETRIUM; DILATATION AND CURETTAGE:  - POLYPOID FRAGMENTS OF SECRETORY ENDOMETRIUM WITH MILD BREAKDOWN  CHANGE.  - NEGATIVE FOR HYPERPLASIA AND CARCINOMA.   Assessment: 46 y.o. s/p hysteroscopy, D&C stable  Plan: Patient has done well after surgery with no apparent complications.  I have discussed the post-operative course to date, and the expected progress moving forward.  The patient understands what complications to be concerned about.  I will see the patient in routine follow up, or sooner if needed.    Activity plan: No restriction. - would not expect much ramping post procedure.  D&C was done via myosure so no perforation concern, discussed rare.low risk of endometiris but will treat with 10 day course of doxycyline - If next menses heavy patient is interested in proceeding with hysterectomy Malachy Mood 05/07/2017, 11:03 AM

## 2017-05-12 ENCOUNTER — Telehealth: Payer: Self-pay

## 2017-05-12 NOTE — Telephone Encounter (Signed)
FMLA/DISABILITY form for [employer] filled out and given to [place] for processing after signature obtained.

## 2017-05-14 NOTE — Telephone Encounter (Signed)
Disability paperwork faxed to [place].

## 2017-06-10 ENCOUNTER — Ambulatory Visit (INDEPENDENT_AMBULATORY_CARE_PROVIDER_SITE_OTHER): Payer: 59 | Admitting: Obstetrics and Gynecology

## 2017-06-10 ENCOUNTER — Encounter: Payer: Self-pay | Admitting: Obstetrics and Gynecology

## 2017-06-10 VITALS — BP 130/88 | HR 106 | Wt 160.0 lb

## 2017-06-10 DIAGNOSIS — Z4889 Encounter for other specified surgical aftercare: Secondary | ICD-10-CM

## 2017-06-10 NOTE — Progress Notes (Signed)
      Postoperative Follow-up Patient presents post op from hysteroscopy, D&C 6weeks ago for abnormal uterine bleeding and dysmenorrhea, and endometrial polyp.  Subjective: Patient reports some improvement in her preop symptoms. Eating a regular diet without difficulty. Pain is controlled without any medications.  Activity: normal activities of daily living. No menses since procedure but continued to have cramping.  Objective: Vitals:   06/10/17 1608  BP: 130/88  Pulse: (!) 106   Gen: NAD HEENT: normocephalic, anicteric Abdomen: soft, non-tender, non-distended GU: normal external female genitalia, normal cervix, no CMT or uterine tenderness. No adnexal masses or tenderness Ext: no edema  Assessment: 47 y.o. s/p hysteroscopy, D&C stable  Plan: Patient has done well after surgery with no apparent complications.  I have discussed the post-operative course to date, and the expected progress moving forward.  The patient understands what complications to be concerned about.  I will see the patient in routine follow up, or sooner if needed.    Activity plan: No restriction. - Still cramping no bleeding or menses since last visit.  Discussed should cramping continue given midline or bleeding return heavy hysterectomy is a viable option  [employer] 06/12/2017, 11:35 AM

## 2017-07-18 ENCOUNTER — Encounter: Payer: Self-pay | Admitting: Emergency Medicine

## 2017-07-18 ENCOUNTER — Ambulatory Visit
Admission: EM | Admit: 2017-07-18 | Discharge: 2017-07-18 | Disposition: A | Discharge status: Home / Self Care | Payer: 59 | Attending: Emergency Medicine | Admitting: Emergency Medicine

## 2017-07-18 ENCOUNTER — Other Ambulatory Visit: Payer: Self-pay

## 2017-07-18 DIAGNOSIS — J0110 Acute frontal sinusitis, unspecified: Secondary | ICD-10-CM | POA: Diagnosis not present

## 2017-07-18 MED ORDER — HYDROCOD POLST-CPM POLST ER 10-8 MG/5ML PO SUER: 5.0000 mL | Freq: Two times a day (BID) | ORAL | 0 refills | Status: DC | PRN

## 2017-07-18 MED ORDER — IBUPROFEN 600 MG PO TABS: 600.0000 mg | ORAL_TABLET | Freq: Four times a day (QID) | ORAL | 0 refills | Status: DC | PRN

## 2017-07-18 MED ORDER — DOXYCYCLINE HYCLATE 100 MG PO CAPS: 100.0000 mg | ORAL_CAPSULE | Freq: Two times a day (BID) | ORAL | 0 refills | Status: AC

## 2017-07-18 MED ORDER — FLUTICASONE PROPIONATE 50 MCG/ACT NA SUSP: 2.0000 | Freq: Every day | NASAL | 0 refills | Status: DC

## 2017-07-18 NOTE — ED Triage Notes (Signed)
Patient in today c/o cough, nasal congestion and headache x 1 week. Patient denies fever. Patient has tried OTC Theraflu and Tylenol Cold and Sinus.

## 2017-07-18 NOTE — Discharge Instructions (Signed)
Take the medication as written. Start Mucinex-D to keep the mucous thin and to decongest you.  Return to the ER if you get worse, have a fever >100.4, or for any concerns. You may take 600 mg of motrin with 1 gram of tylenol up to 3-4 times a day as needed for pain. This is an effective combination for pain.  Most sinus infections are viral and do not need antibiotics unless you have a high fever, have had this for 10 days, or you get better and then get sick again. Use a NeilMed sinus rinse as often as you want to to reduce nasal congestion. Follow the directions on the box.   Go to www.goodrx.com to look up your medications. This will give you a list of where you can find your prescriptions at the most affordable prices. Or you can ask the pharmacist what the cash price is. This is frequently cheaper than going through insurance.

## 2017-07-18 NOTE — ED Provider Notes (Signed)
HPI  SUBJECTIVE:  Madison Adams is a 47 y.o. female who presents with clear nasal congestion, rhinorrhea, postnasal drip, sinus pain and pressure, bilateral ear pain/pressure and a nonproductive cough for over a week- about 10 days she reports a scratchy, sore throat, states that she is coughing all night long.  She has had multiple sick contacts with same symptoms.  She denies fevers, body aches, other headaches, change in hearing, otorrhea, upper dental pain.  No wheezing, chest pain, shortness of breath, dyspnea on exertion.  She has been taking TheraFlu, NyQuil, Tylenol Sinus and doing saline nasal irrigation.  Tylenol Sinus seems to help.  No aggravating factors.  No antibiotics in the past month.  She took an antipyretic within 6-8 hours of evaluation.  She states that she got better and then she got worse.  She had a flu shot this year.  She has a past medical history of pneumonia, otitis media.  She is a smoker.  No history of recurrent sinusitis, allergies, diabetes, hypertension, asthma, emphysema, COPD.  LMP: 1/19.  Denies possibility of being pregnant.  PMD: She sees her OB/GYN  Past Medical History:  Diagnosis Date  . Anemia    WITH PREGNANCY ONLY  . Complication of anesthesia    HARD TIME WAKING UP FOR 1ST BREAST SURGERY  . Family history of adverse reaction to anesthesia    MOM-N/V  . Heart murmur    AS A CHILD  . Hematuria, microscopic   . Ovarian cyst   . Pyelonephritis     Past Surgical History:  Procedure Laterality Date  . BREAST BIOPSY Right 10-28-13   FOCUS OF ATYPICAL DUCTAL HYPERPLASIA, 1 MM.  Marland Kitchen BREAST SURGERY Right 01-07-14   1 mm foci of atypical ductal hyperplasia, Gail model risk 3.8%, 5 years; 34.5% lifetime.  Marland Kitchen HYSTEROSCOPY W/D&C N/A 05/02/2017   Procedure: DILATATION AND CURETTAGE /HYSTEROSCOPY;  Surgeon: Malachy Mood, MD;  Location: [facility] ORS;  Service: Gynecology;  Laterality: N/A;  . TUBAL LIGATION  01/14/96  . UMBILICAL HERNIA REPAIR N/A 10/30/2016    Procedure: HERNIA REPAIR UMBILICAL ADULT;  Surgeon: Robert Bellow, MD;  Location: [facility] ORS;  Service: General;  Laterality: N/A;    Family History  Problem Relation Age of Onset  . Breast cancer Mother 47  . Healthy Father   . Heart failure Paternal Grandfather 74    Social History   Tobacco Use  . Smoking status: Current Every Day Smoker    Packs/day: 0.50    Years: 11.00    Pack years: 5.50    Types: Cigarettes    Start date: 01/16/1997  . Smokeless tobacco: Never Used  Substance Use Topics  . Alcohol use: No    Alcohol/week: 0.0 oz  . Drug use: No    No current facility-administered medications for this encounter.   Current Outpatient Medications:  Marland Kitchen  Multiple Vitamin (MULTIVITAMIN WITH MINERALS) TABS tablet, Take 1 tablet by mouth daily. Women's Multivitamin, Disp: , Rfl:  .  raloxifene (EVISTA) 60 MG tablet, Take 1 tablet (60 mg total) by mouth daily., Disp: 90 tablet, Rfl: 4 .  chlorpheniramine-HYDROcodone (TUSSIONEX PENNKINETIC ER) 10-8 MG/5ML SUER, Take 5 mLs by mouth every 12 (twelve) hours as needed for cough., Disp: 120 mL, Rfl: 0 .  doxycycline (VIBRAMYCIN) 100 MG capsule, Take 1 capsule (100 mg total) by mouth 2 (two) times daily for 7 days., Disp: 14 capsule, Rfl: 0 .  fluticasone (FLONASE) 50 MCG/ACT nasal spray, Place 2 sprays into both nostrils  daily., Disp: 16 g, Rfl: 0 .  ibuprofen (ADVIL,MOTRIN) 600 MG tablet, Take 1 tablet (600 mg total) by mouth every 6 (six) hours as needed., Disp: 30 tablet, Rfl: 0  Allergies  Allergen Reactions  . Penicillins Anaphylaxis, Hives, Shortness Of Breath, Swelling and Other (See Comments)    Has patient had a PCN reaction causing immediate rash, facial/tongue/throat swelling, SOB or lightheadedness with hypotension: Yes Has patient had a PCN reaction causing severe rash involving mucus membranes or skin necrosis: Yes Has patient had a PCN reaction that required hospitalization:Treated in ER Has patient had a PCN  reaction occurring within the last 10 years: No If all of the above answers are "NO", then may proceed with Cephalosporin use. Childhood allergy     ROS  As noted in HPI.   Physical Exam  BP 137/77 (BP Location: Left Arm)   Pulse 87   Temp 97.9 F (36.6 C) (Oral)   Resp 16   Ht 5\' 2"  (1.575 m)   Wt 160 lb (72.6 kg)   LMP 06/14/2017   SpO2 99%   BMI 29.26 kg/m   Constitutional: Well developed, well nourished, no acute distress Eyes:  EOMI, conjunctiva normal bilaterally HENT: Normocephalic, atraumatic,mucus membranes moist. TMs normal bilaterally. +  purulent nasal congestion. Normal turbinates. - maxillary sinus tenderness, + frontal sinus tenderness. Oropharynx normal  +  postnasal drip.  Respiratory: Normal inspiratory effort, lungs clear bilaterally Cardiovascular: Normal rate regular rhythm, no murmurs, rubs, gallops GI: nondistended skin: No rash, skin intact Musculoskeletal: no deformities Neurologic: Alert & oriented x 3, no focal neuro deficits Psychiatric: Speech and behavior appropriate   ED Course   Medications - No data to display  No orders of the defined types were placed in this encounter.   No results found for this or any previous visit (from the past 24 hour(s)). No results found.  ED Clinical Impression  Acute non-recurrent frontal sinusitis   ED Assessment/Plan  [place] narcotic database queried. Last narcotic rx 04/2017. Feel that it is appropriate to rx controlled substance today.  Pt with indications for abx given double sickening and duration of symptoms.. Will start doxycycline in addition to nasal steriods, mucinex-d, saline nasal irrigation, increase fluids, tylenol/motrin prn pain.  Tussionex for the cough.  Discussed MDM and plan with pt.. Pt agrees with plan and will f/u with PMD prn.  *This clinic note was created using Dragon dictation software. Therefore, there may be occasional mistakes despite careful proofreading.  ?     Melynda Ripple, MD 07/18/17 1358

## 2017-07-28 ENCOUNTER — Telehealth: Payer: Self-pay | Admitting: *Deleted

## 2017-07-28 NOTE — Telephone Encounter (Signed)
Patient called requesting additional antibiotics for new symptoms of nasal and sinus swelling. Patient was treated on 07/18/17 for frontal sinus infection. Advised patient that she wound need to return to be evaluated for new symptoms of swollen nose before antibiotics could be prescribed. Patient confirmed understanding of information.

## 2017-08-14 ENCOUNTER — Other Ambulatory Visit: Payer: Self-pay

## 2017-08-14 DIAGNOSIS — N6091 Unspecified benign mammary dysplasia of right breast: Secondary | ICD-10-CM

## 2017-09-18 ENCOUNTER — Ambulatory Visit
Admission: RE | Admit: 2017-09-18 | Discharge: 2017-09-18 | Disposition: A | Discharge status: Home / Self Care | Payer: 59 | Source: Ambulatory Visit | Attending: General Surgery | Admitting: General Surgery

## 2017-09-18 DIAGNOSIS — N6091 Unspecified benign mammary dysplasia of right breast: Secondary | ICD-10-CM | POA: Diagnosis not present

## 2017-09-30 ENCOUNTER — Encounter: Payer: Self-pay | Admitting: General Surgery

## 2017-09-30 ENCOUNTER — Ambulatory Visit (INDEPENDENT_AMBULATORY_CARE_PROVIDER_SITE_OTHER): Payer: 59 | Admitting: General Surgery

## 2017-09-30 VITALS — BP 120/80 | HR 70 | Resp 12 | Ht 62.0 in | Wt 160.0 lb

## 2017-09-30 DIAGNOSIS — N6091 Unspecified benign mammary dysplasia of right breast: Secondary | ICD-10-CM

## 2017-09-30 NOTE — Progress Notes (Signed)
Patient ID: Madison Adams, female   DOB: 05-28-1970, 47 y.o.   MRN: [id number]  Chief Complaint  Patient presents with  . Follow-up    HPI Madison Adams is a 47 y.o. female who presents for a breast evaluation. The most recent mammogram was done on 09/18/2017 .  Patient does perform regular self breast checks and gets regular mammograms done.    HPI  Past Medical History:  Diagnosis Date  . Anemia    WITH PREGNANCY ONLY  . Complication of anesthesia    HARD TIME WAKING UP FOR 1ST BREAST SURGERY  . Family history of adverse reaction to anesthesia    MOM-N/V  . Heart murmur    AS A CHILD  . Hematuria, microscopic   . Ovarian cyst   . Pyelonephritis     Past Surgical History:  Procedure Laterality Date  . BREAST BIOPSY Right 10-28-13   FOCUS OF ATYPICAL DUCTAL HYPERPLASIA, 1 MM.  Madison Adams BREAST SURGERY Right 01-07-14   1 mm foci of atypical ductal hyperplasia, Gail model risk 3.8%, 5 years; 34.5% lifetime.  Madison Adams HYSTEROSCOPY W/D&C N/A 05/02/2017   Procedure: DILATATION AND CURETTAGE /HYSTEROSCOPY;  Surgeon: Malachy Mood, MD;  Location: [facility] ORS;  Service: Gynecology;  Laterality: N/A;  . TUBAL LIGATION  01/14/96  . UMBILICAL HERNIA REPAIR N/A 10/30/2016   Procedure: HERNIA REPAIR UMBILICAL ADULT;  Surgeon: Robert Bellow, MD;  Location: [facility] ORS;  Service: General;  Laterality: N/A;    Family History  Problem Relation Age of Onset  . Breast cancer Mother 19  . Healthy Father   . Heart failure Paternal Grandfather 74    Social History Social History   Tobacco Use  . Smoking status: Current Every Day Smoker    Packs/day: 0.50    Years: 11.00    Pack years: 5.50    Types: Cigarettes    Start date: 01/16/1997  . Smokeless tobacco: Never Used  Substance Use Topics  . Alcohol use: No    Alcohol/week: 0.0 oz  . Drug use: No    Allergies  Allergen Reactions  . Penicillins Anaphylaxis, Hives, Shortness Of Breath, Swelling and Other (See Comments)    Has patient had  a PCN reaction causing immediate rash, facial/tongue/throat swelling, SOB or lightheadedness with hypotension: Yes Has patient had a PCN reaction causing severe rash involving mucus membranes or skin necrosis: Yes Has patient had a PCN reaction that required hospitalization:Treated in ER Has patient had a PCN reaction occurring within the last 10 years: No If all of the above answers are "NO", then may proceed with Cephalosporin use. Childhood allergy    Current Outpatient Medications  Medication Sig Dispense Refill  . chlorpheniramine-HYDROcodone (TUSSIONEX PENNKINETIC ER) 10-8 MG/5ML SUER Take 5 mLs by mouth every 12 (twelve) hours as needed for cough. 120 mL 0  . fluticasone (FLONASE) 50 MCG/ACT nasal spray Place 2 sprays into both nostrils daily. 16 g 0  . ibuprofen (ADVIL,MOTRIN) 600 MG tablet Take 1 tablet (600 mg total) by mouth every 6 (six) hours as needed. 30 tablet 0  . Multiple Vitamin (MULTIVITAMIN WITH MINERALS) TABS tablet Take 1 tablet by mouth daily. Women's Multivitamin    . raloxifene (EVISTA) 60 MG tablet Take 1 tablet (60 mg total) by mouth daily. 90 tablet 4   No current facility-administered medications for this visit.     Review of Systems Review of Systems  Constitutional: Negative.   Respiratory: Negative.   Cardiovascular: Negative.     Blood  pressure 120/80, pulse 70, resp. rate 12, height 5\' 2"  (1.575 m), weight 160 lb (72.6 kg).  Physical Exam Physical Exam  Constitutional: She is oriented to person, place, and time. She appears well-developed and well-nourished.  Eyes: Conjunctivae are normal. No scleral icterus.  Neck: Neck supple.  Cardiovascular: Normal rate, regular rhythm and normal heart sounds.  Pulmonary/Chest: Effort normal and breath sounds normal. Right breast exhibits no inverted nipple, no mass, no nipple discharge, no skin change and no tenderness. Left breast exhibits no inverted nipple, no mass, no nipple discharge, no skin change and  no tenderness.    Abdominal: Soft. Normal appearance. No hernia.  Lymphadenopathy:    She has no cervical adenopathy.    She has no axillary adenopathy.  Neurological: She is alert and oriented to person, place, and time.  Skin: Skin is warm and dry.    Data Reviewed Bilateral screening mammograms dated September 18, 2017 showed no interval change.  BI-RADS-2.  Assessment    1 mm foci ADH, on Evista for chemoprevention.    Plan  The patient has been asked to return to the office in one year with a bilateral screeningmammogram. The patient is aware to call back for any questions or concerns.   HPI, Physical Exam, Assessment and Plan have been scribed under the direction and in the presence of Madison Ard, MD.  Madison Adams, CMA  I have completed the exam and reviewed the above documentation for accuracy and completeness.  I agree with the above.  Haematologist has been used and any errors in dictation or transcription are unintentional.  Madison Adams, M.D., F.A.C.S.  Madison Adams Madison Adams 09/30/2017, 5:01 PM

## 2017-09-30 NOTE — Patient Instructions (Addendum)
  The patient has been asked to return to the office in one year with a bilateral screeningmammogram. The patient is aware to call back for any questions or concerns.

## 2017-11-20 NOTE — Progress Notes (Signed)
Gynecology Abnormal Uterine Bleeding Initial Evaluation   Chief Complaint: No chief complaint on file.   History of Present Illness:    Paitient is a 47 y.o. [id number] who LMP was No LMP recorded., presents today for a problem visit.  She complains of menorrhagia starting with her menses in April, had three menstrual cycles in May.  Brief improvement after D&C and polypectomy.  The patient is sexually active. She currently uses tubal ligationfor contraception.  Also notable and limiting hormonal management is a personal history of atypical ductal hyperplasia of the right breast currently on Avista for chemoprophylaxis.  BRCA testing was previously obtained and negative.  Last pap 03/28/2017 NIL HPV negative.  Previous evaluation: Hysteroscopy, D&C 05/02/2017 for endometrial polyp with pathology showing polypoid fragments of secretory endometrium.  Prior Diagnosis: AUB-P  LMP: No LMP recorded.   Review of Systems: Review of Systems  Constitutional: Negative for chills and fever.  HENT: Negative for congestion.   Respiratory: Negative for cough and shortness of breath.   Cardiovascular: Negative for chest pain and palpitations.  Gastrointestinal: Negative for abdominal pain, constipation, diarrhea, heartburn, nausea and vomiting.  Genitourinary: Negative for dysuria, frequency and urgency.  Skin: Negative for itching and rash.  Neurological: Negative for dizziness and headaches.  Endo/Heme/Allergies: Negative for polydipsia.  Psychiatric/Behavioral: Negative for depression.    Past Medical History:  Past Medical History:  Diagnosis Date  . Anemia    WITH PREGNANCY ONLY  . Complication of anesthesia    HARD TIME WAKING UP FOR 1ST BREAST SURGERY  . Family history of adverse reaction to anesthesia    MOM-N/V  . Heart murmur    AS A CHILD  . Hematuria, microscopic   . Ovarian cyst   . Pyelonephritis     Past Surgical History:  Past Surgical History:  Procedure Laterality  Date  . BREAST BIOPSY Right 10-28-13   FOCUS OF ATYPICAL DUCTAL HYPERPLASIA, 1 MM.  Marland Kitchen BREAST SURGERY Right 01-07-14   1 mm foci of atypical ductal hyperplasia, Gail model risk 3.8%, 5 years; 34.5% lifetime.  Marland Kitchen HYSTEROSCOPY W/D&C N/A 05/02/2017   Procedure: DILATATION AND CURETTAGE /HYSTEROSCOPY;  Surgeon: Malachy Mood, MD;  Location: [facility] ORS;  Service: Gynecology;  Laterality: N/A;  . TUBAL LIGATION  01/14/96  . UMBILICAL HERNIA REPAIR N/A 10/30/2016   Procedure: HERNIA REPAIR UMBILICAL ADULT;  Surgeon: Robert Bellow, MD;  Location: [facility] ORS;  Service: General;  Laterality: N/A;    Obstetric History: [id number]  Family History:  Family History  Problem Relation Age of Onset  . Breast cancer Mother 20  . Healthy Father   . Heart failure Paternal Grandfather 74    Social History:  Social History   Socioeconomic History  . Marital status: Single    Spouse name: Not on file  . Number of children: Not on file  . Years of education: Not on file  . Highest education level: Not on file  Occupational History  . Not on file  Social Needs  . Financial resource strain: Not on file  . Food insecurity:    Worry: Not on file    Inability: Not on file  . Transportation needs:    Medical: Not on file    Non-medical: Not on file  Tobacco Use  . Smoking status: Current Every Day Smoker    Packs/day: 0.50    Years: 11.00    Pack years: 5.50    Types: Cigarettes    Start date: 01/16/1997  .  Smokeless tobacco: Never Used  Substance and Sexual Activity  . Alcohol use: No    Alcohol/week: 0.0 oz  . Drug use: No  . Sexual activity: Yes    Birth control/protection: None  Lifestyle  . Physical activity:    Days per week: Not on file    Minutes per session: Not on file  . Stress: Not on file  Relationships  . Social connections:    Talks on phone: Not on file    Gets together: Not on file    Attends religious service: Not on file    Active member of club or organization: Not on  file    Attends meetings of clubs or organizations: Not on file    Relationship status: Not on file  . Intimate partner violence:    Fear of current or ex partner: Not on file    Emotionally abused: Not on file    Physically abused: Not on file    Forced sexual activity: Not on file  Other Topics Concern  . Not on file  Social History Narrative  . Not on file    Allergies:  Allergies  Allergen Reactions  . Penicillins Anaphylaxis, Hives, Shortness Of Breath, Swelling and Other (See Comments)    Has patient had a PCN reaction causing immediate rash, facial/tongue/throat swelling, SOB or lightheadedness with hypotension: Yes Has patient had a PCN reaction causing severe rash involving mucus membranes or skin necrosis: Yes Has patient had a PCN reaction that required hospitalization:Treated in ER Has patient had a PCN reaction occurring within the last 10 years: No If all of the above answers are "NO", then may proceed with Cephalosporin use. Childhood allergy    Medications: Prior to Admission medications   Medication Sig Start Date End Date Taking? Authorizing Provider  chlorpheniramine-HYDROcodone (TUSSIONEX PENNKINETIC ER) 10-8 MG/5ML SUER Take 5 mLs by mouth every 12 (twelve) hours as needed for cough. 07/18/17   Melynda Ripple, MD  fluticasone (FLONASE) 50 MCG/ACT nasal spray Place 2 sprays into both nostrils daily. 07/18/17   Melynda Ripple, MD  ibuprofen (ADVIL,MOTRIN) 600 MG tablet Take 1 tablet (600 mg total) by mouth every 6 (six) hours as needed. 07/18/17   Melynda Ripple, MD  Multiple Vitamin (MULTIVITAMIN WITH MINERALS) TABS tablet Take 1 tablet by mouth daily. Women's Multivitamin    [provider]  raloxifene (EVISTA) 60 MG tablet Take 1 tablet (60 mg total) by mouth daily. 01/02/17   Robert Bellow, MD    Physical Exam There were no vitals taken for this visit.  No LMP recorded.  General: NAD HEENT: normocephalic, anicteric Pulmonary: No  increased work of breathing Extremities: no edema, erythema, or tenderness Neurologic: Grossly intact Psychiatric: mood appropriate, affect full   Assessment: 47 y.o. [id number] with abnormal uterine bleeding  Plan: Problem List Items Addressed This Visit    None      1) Discussed management options for abnormal uterine bleeding including expectant, NSAIDs, tranexamic acid (Lysteda), oral progesterone (Provera, norethindrone, megace), Depo Provera, Levonorgestrel containing IUD, endometrial ablation (Novasure) or hysterectomy as definitive surgical management.  Declines trial of MIrena.   - prior D&C pathology reviwed - Pap 03/28/2017 NIL HPV negative - non-hormonal means of cycle control include Lysteda, endometrial ablation, and hysterectomy.  Patient opts for hysterectomy - CBC today  2) No follow-ups on file.   Malachy Mood, MD, [facility], [facility] 11/20/2017, 9:02 PM

## 2017-11-21 ENCOUNTER — Encounter: Payer: Self-pay | Admitting: Obstetrics and Gynecology

## 2017-11-21 ENCOUNTER — Encounter

## 2017-11-21 ENCOUNTER — Ambulatory Visit (INDEPENDENT_AMBULATORY_CARE_PROVIDER_SITE_OTHER): Payer: 59 | Admitting: Obstetrics and Gynecology

## 2017-11-21 VITALS — BP 138/82 | HR 103 | Ht 62.0 in | Wt 158.8 lb

## 2017-11-21 DIAGNOSIS — N939 Abnormal uterine and vaginal bleeding, unspecified: Secondary | ICD-10-CM | POA: Diagnosis not present

## 2017-11-22 LAB — CBC
HEMATOCRIT: 38.5 % (ref 34.0–46.6)
Hemoglobin: 12.6 g/dL (ref 11.1–15.9)
MCH: 30.1 pg (ref 26.6–33.0)
MCHC: 32.7 g/dL (ref 31.5–35.7)
MCV: 92 fL (ref 79–97)
Platelets: 321 10*3/uL (ref 150–450)
RBC: 4.18 x10E6/uL (ref 3.77–5.28)
RDW: 12.9 % (ref 12.3–15.4)
WBC: 7.9 10*3/uL (ref 3.4–10.8)

## 2017-11-25 ENCOUNTER — Telehealth: Payer: Self-pay | Admitting: Obstetrics and Gynecology

## 2017-11-25 NOTE — Telephone Encounter (Signed)
Patient is aware of H&P at [facility] on 12/05/17 2 11:10am w/ Dr. Georgianne Fick, Pre-admit Testing afterwards, and OR on 12/11/17. Patient is aware she may receive calls from the [facility] and [facility]. Patient confirmed [employer], and no secondary insurance. Ext given.

## 2017-11-25 NOTE — Telephone Encounter (Signed)
Patient called, her surgery is scheduled for 12/11/17 and she is planning a vacation for 7/27. She needs to know whether it is okay to travel (riding in a car for 3 hours), patient will be at the beach, needs to know how much she can lift, etc.

## 2017-11-25 NOTE — Telephone Encounter (Signed)
Lmtrc

## 2017-12-01 NOTE — Telephone Encounter (Signed)
Lmtrc

## 2017-12-01 NOTE — Telephone Encounter (Signed)
Should be ok I will see her the week after as well to see how she is doing postoperativey

## 2017-12-01 NOTE — Telephone Encounter (Signed)
Patient is aware and the H&P and Pre-admit Testing appointments are confirmed.

## 2017-12-05 ENCOUNTER — Encounter
Admission: RE | Admit: 2017-12-05 | Discharge: 2017-12-05 | Disposition: A | Discharge status: Home / Self Care | Payer: 59 | Source: Ambulatory Visit | Attending: Obstetrics and Gynecology | Admitting: Obstetrics and Gynecology

## 2017-12-05 ENCOUNTER — Encounter: Payer: Self-pay | Admitting: Obstetrics and Gynecology

## 2017-12-05 ENCOUNTER — Other Ambulatory Visit: Payer: Self-pay

## 2017-12-05 ENCOUNTER — Ambulatory Visit (INDEPENDENT_AMBULATORY_CARE_PROVIDER_SITE_OTHER): Payer: 59 | Admitting: Obstetrics and Gynecology

## 2017-12-05 VITALS — BP 124/80 | HR 103 | Ht 62.0 in | Wt 157.0 lb

## 2017-12-05 DIAGNOSIS — Z01818 Encounter for other preprocedural examination: Secondary | ICD-10-CM | POA: Diagnosis not present

## 2017-12-05 DIAGNOSIS — N939 Abnormal uterine and vaginal bleeding, unspecified: Secondary | ICD-10-CM

## 2017-12-05 DIAGNOSIS — Z01812 Encounter for preprocedural laboratory examination: Secondary | ICD-10-CM | POA: Diagnosis not present

## 2017-12-05 LAB — COMPREHENSIVE METABOLIC PANEL
ALBUMIN: 4 g/dL (ref 3.5–5.0)
ALK PHOS: 52 U/L (ref 38–126)
ALT: 28 U/L (ref 0–44)
ANION GAP: 8 (ref 5–15)
AST: 27 U/L (ref 15–41)
BILIRUBIN TOTAL: 0.5 mg/dL (ref 0.3–1.2)
BUN: 14 mg/dL (ref 6–20)
CALCIUM: 8.8 mg/dL — AB (ref 8.9–10.3)
CO2: 23 mmol/L (ref 22–32)
Chloride: 106 mmol/L (ref 98–111)
Creatinine, Ser: 0.57 mg/dL (ref 0.44–1.00)
GFR calc Af Amer: >60 mL/min (ref >60)
GFR calc non Af Amer: >60 mL/min (ref >60)
GLUCOSE: 86 mg/dL (ref 70–99)
Potassium: 3.7 mmol/L (ref 3.5–5.1)
SODIUM: 137 mmol/L (ref 135–145)
TOTAL PROTEIN: 7.5 g/dL (ref 6.5–8.1)

## 2017-12-05 LAB — CBC
HEMATOCRIT: 37.1 % (ref 35.0–47.0)
HEMOGLOBIN: 13 g/dL (ref 12.0–16.0)
MCH: 31.6 pg (ref 26.0–34.0)
MCHC: 35.1 g/dL (ref 32.0–36.0)
MCV: 90 fL (ref 80.0–100.0)
Platelets: 306 10*3/uL (ref 150–440)
RBC: 4.12 MIL/uL (ref 3.80–5.20)
RDW: 12.7 % (ref 11.5–14.5)
WBC: 8.1 10*3/uL (ref 3.6–11.0)

## 2017-12-05 LAB — TYPE AND SCREEN
ABO/RH(D): A POS
Antibody Screen: NEGATIVE

## 2017-12-05 NOTE — Patient Instructions (Signed)
Your procedure is scheduled on: Thursday, July 18TH  Report to [place]   DO NOT STOP ON THE FIRST FLOOR TO REGISTER  To find out your arrival time please call [phone] between 1PM - 3PM on Wednesday, December 10, 2017  Remember: Instructions that are not followed completely may result in serious medical risk,  up to and including death, or upon the discretion of your surgeon and anesthesiologist your  surgery may need to be rescheduled.     _X__ 1. Do not eat food after midnight the night before your procedure.                 No gum chewing or hard candies. ABSOLUTELY NOTHING SOLID IN YOUR MOUTH AFTER MIDNIGHT                   You may drink clear liquids up to 2 hours before you are scheduled to arrive for your surgery-                   DO not drink clear liquids within 2 hours of the start of your surgery .                 Clear Liquids include:  water, apple juice without pulp, clear carbohydrate                 drink such as Clearfast of Gatorade, Black Coffee or Tea (Do not add                 anything to coffee or tea).  __X__2.  On the morning of surgery brush your teeth with toothpaste and water,                  You may rinse your mouth with mouthwash if you wish.                    Do not swallow any toothpaste of mouthwash.     _X__ 3.  No Alcohol for 24 hours before or after surgery.   _X__ 4.  Do Not Smoke or use e-cigarettes For 24 Hours Prior to Your Surgery.                 Do not use any chewable tobacco products for at least 6 hours prior to                 surgery.  ____  5.  Bring all medications with you on the day of surgery if instructed.   _X___  6.  Notify your doctor if there is any change in your medical condition      (cold, fever, infections).     Do not wear jewelry, make-up, hairpins, clips or nail polish. Do not wear lotions, powders, or perfumes. You may wear deodorant. Do not shave 48  hours prior to surgery. Men may shave face and neck. Do not bring valuables to the hospital.    [facility] is not responsible for any belongings or valuables.  Contacts, dentures or bridgework may not be worn into surgery. Leave your suitcase in the car. After surgery it may be brought to your room. For patients admitted to the hospital, discharge time is determined by your treatment team.   Patients discharged the day of surgery will not be allowed to drive home.   Please read over the following fact sheets that you were given:  PREPARING FOR SURGERY   ____ Take these medicines the morning of surgery with A SIP OF WATER:    1. NONE  2.   3.   4.  5.  6.  ____ Fleet Enema (as directed)   __X__ Use CHG Soap as directed  _X___ Stop ALL ASPIRIN PRODUCTS AS OF TODAY           THIS INCLUDES EXCEDRIN / BC POWDERS / GOODIES POWDER  _X___ Stop Anti-inflammatories AS OF TODAY             THIS INCLUDES IBUPROFEN / MOTRIN / ADVIL / ALEVE / MIDOL   ____ Stop supplements until after surgery.                 THIS INCLUDES YOUR MULTIVITS   ____ Bring C-Pap to the hospital.   CONTINUE TAKING RALOXIFENE AS USUAL, NOT ON DAY OF SURGERY.  WEAR LOOSE CLOTHES AND STURDY SHOES

## 2017-12-05 NOTE — Progress Notes (Signed)
Obstetrics & Gynecology Surgery H&P    Chief Complaint: Scheduled Surgery   History of Present Illness: Patient is a 47 y.o. [id number] presenting for scheduled TLH, BS, cystoscopy, for the treatment or further evaluation of AUB.   Prior Treatments prior to proceeding with surgery include: D&C and polypectomy  Preoperative Pap: 03/28/2017  Results: no abnormalities  Preoperative Endometrial biopsy: 05/02/2017 Findings: pathology proliferative endometrium endometrial polyp Preoperative Ultrasound: 04/11/2017 normal other than endometrial polyp   Review of Systems:10 point review of systems  Past Medical History:  Past Medical History:  Diagnosis Date  . Anemia    WITH PREGNANCY ONLY  . Complication of anesthesia    HARD TIME WAKING UP FOR 1ST BREAST SURGERY  . Family history of adverse reaction to anesthesia    MOM-N/V  . Heart murmur    AS A CHILD  . Hematuria, microscopic   . Ovarian cyst   . Pyelonephritis     Past Surgical History:  Past Surgical History:  Procedure Laterality Date  . BREAST BIOPSY Right 10-28-13   FOCUS OF ATYPICAL DUCTAL HYPERPLASIA, 1 MM.  Marland Kitchen BREAST SURGERY Right 01-07-14   1 mm foci of atypical ductal hyperplasia, Gail model risk 3.8%, 5 years; 34.5% lifetime.  Marland Kitchen HYSTEROSCOPY W/D&C N/A 05/02/2017   Procedure: DILATATION AND CURETTAGE /HYSTEROSCOPY;  Surgeon: Malachy Mood, MD;  Location: [facility] ORS;  Service: Gynecology;  Laterality: N/A;  . TUBAL LIGATION  01/14/96  . UMBILICAL HERNIA REPAIR N/A 10/30/2016   Procedure: HERNIA REPAIR UMBILICAL ADULT;  Surgeon: Robert Bellow, MD;  Location: [facility] ORS;  Service: General;  Laterality: N/A;    Family History:  Family History  Problem Relation Age of Onset  . Breast cancer Mother 43  . Healthy Father   . Heart failure Paternal Grandfather 74    Social History:  Social History   Socioeconomic History  . Marital status: Single    Spouse name: Not on file  . Number of children: Not on file  .  Years of education: Not on file  . Highest education level: Not on file  Occupational History  . Not on file  Social Needs  . Financial resource strain: Not on file  . Food insecurity:    Worry: Not on file    Inability: Not on file  . Transportation needs:    Medical: Not on file    Non-medical: Not on file  Tobacco Use  . Smoking status: Current Every Day Smoker    Packs/day: 0.50    Years: 11.00    Pack years: 5.50    Types: Cigarettes    Start date: 01/16/1997  . Smokeless tobacco: Never Used  Substance and Sexual Activity  . Alcohol use: No    Alcohol/week: 0.0 oz  . Drug use: No  . Sexual activity: Yes    Birth control/protection: None  Lifestyle  . Physical activity:    Days per week: Not on file    Minutes per session: Not on file  . Stress: Not on file  Relationships  . Social connections:    Talks on phone: Not on file    Gets together: Not on file    Attends religious service: Not on file    Active member of club or organization: Not on file    Attends meetings of clubs or organizations: Not on file    Relationship status: Not on file  . Intimate partner violence:    Fear of current or ex partner: Not on  file    Emotionally abused: Not on file    Physically abused: Not on file    Forced sexual activity: Not on file  Other Topics Concern  . Not on file  Social History Narrative  . Not on file    Allergies:  Allergies  Allergen Reactions  . Penicillins Anaphylaxis, Hives, Shortness Of Breath, Swelling and Other (See Comments)    Has patient had a PCN reaction causing immediate rash, facial/tongue/throat swelling, SOB or lightheadedness with hypotension: Yes Has patient had a PCN reaction causing severe rash involving mucus membranes or skin necrosis: Yes Has patient had a PCN reaction that required hospitalization:Treated in ER Has patient had a PCN reaction occurring within the last 10 years: No If all of the above answers are "NO", then may proceed  with Cephalosporin use. Childhood allergy    Medications: Prior to Admission medications   Medication Sig Start Date End Date Taking? Authorizing Provider  ibuprofen (ADVIL,MOTRIN) 200 MG tablet Take 400 mg by mouth 3 (three) times daily as needed for headache or moderate pain.   Yes [provider]  loratadine (CLARITIN) 10 MG tablet Take 10 mg by mouth daily as needed for allergies.   Yes [provider]  Multiple Vitamin (MULTIVITAMIN WITH MINERALS) TABS tablet Take 1 tablet by mouth daily. Women's Multivitamin   Yes [provider]  raloxifene (EVISTA) 60 MG tablet Take 1 tablet (60 mg total) by mouth daily. Patient taking differently: Take 60 mg by mouth every evening.  01/02/17  Yes Byrnett, Forest Gleason, MD    Physical Exam Vitals: Blood pressure 124/80, pulse (!) 103, height 5\' 2"  (1.575 m), weight 157 lb (71.2 kg), last menstrual period 11/12/2017. General: NAD HEENT: normocephalic, anicteric Pulmonary: CTAB, No increased work of breathing Cardiovascular: RRR, distal pulses 2+ Abdomen: soft, non-tender, non-distended Extremities: no edema, erythema, or tenderness Neurologic: Grossly intact Psychiatric: mood appropriate, affect full  Imaging No results found.  Assessment: 47 y.o. [id number] presenting for scheduled TLH, BS, cystoscopy  Plan: 1) Patient opts for definitive surgical management via hysterectomy. The risks of surgery were discussed in detail with the patient including but not limited to: bleeding which may require transfusion or reoperation; infection which may require antibiotics; injury to bowel, bladder, ureters or other surrounding organs (With a literature reported rate of urinary tract injury of 1% quoted); need for additional procedures including laparotomy; thromboembolic phenomenon, incisional problems and other postoperative/anesthesia complications.  Patient was also advised that recovery procedure generally involves an overnight stay;  and the  expected recovery time after a hysterectomy being in the range of 6-8 weeks.  Likelihood of success in alleviating the patient's symptoms was discussed.  While definitive in regards to issues with menstural bleeding, pelvic pain if present preoperatively may continue and in fact worsen postoperatively.  She is aware that the procedure will render her unable to pursue childbearing in the future.   She was told that she will be contacted by our surgical scheduler regarding the time and date of her surgery; routine preoperative instructions of having nothing to eat or drink after midnight on the day prior to surgery and also coming to the hospital 1.5 hours prior to her time of surgery were also emphasized.  She was told she may be called for a preoperative appointment about a week prior to surgery and will be given further preoperative instructions at that visit.  Routine postoperative instructions will be reviewed with the patient and her family in detail after surgery.  Printed patient education handouts about the procedure was given to the patient to review at home.   2) Routine postoperative instructions were reviewed with the patient and her family in detail today including the expected length of recovery and likely postoperative course.  The patient concurred with the proposed plan, giving informed written consent for the surgery today.  Patient instructed on the importance of being NPO after midnight prior to her procedure.  If warranted preoperative prophylactic antibiotics and SCDs ordered on call to the OR to meet SCIP guidelines and adhere to recommendation laid forth in [place] Number 104 May 2009  "Antibiotic Prophylaxis for Gynecologic Procedures".     Malachy Mood, MD, [facility], [facility] 12/05/2017, 11:28 AM

## 2017-12-09 ENCOUNTER — Telehealth: Payer: Self-pay

## 2017-12-09 NOTE — Telephone Encounter (Signed)
FMLA/DISABILITY form for [employer] filled out, signature obtained, and given to [place] for processing.

## 2017-12-10 MED ORDER — GENTAMICIN SULFATE 40 MG/ML IJ SOLN
INTRAVENOUS | Status: AC
  Administered 2017-12-11: 356 mg via INTRAVENOUS
  Filled 2017-12-10: qty 9

## 2017-12-11 ENCOUNTER — Encounter
Admission: RE | Disposition: A | Discharge status: Home / Self Care | Payer: Self-pay | Source: Ambulatory Visit | Attending: Obstetrics and Gynecology

## 2017-12-11 ENCOUNTER — Observation Stay
Admission: RE | Admit: 2017-12-11 | Discharge: 2017-12-12 | Disposition: A | Discharge status: Home / Self Care | Payer: 59 | Source: Ambulatory Visit | Attending: Obstetrics and Gynecology | Admitting: Obstetrics and Gynecology

## 2017-12-11 ENCOUNTER — Inpatient Hospital Stay: Payer: 59 | Admitting: Anesthesiology

## 2017-12-11 ENCOUNTER — Other Ambulatory Visit: Payer: Self-pay

## 2017-12-11 DIAGNOSIS — Z79899 Other long term (current) drug therapy: Secondary | ICD-10-CM | POA: Diagnosis not present

## 2017-12-11 DIAGNOSIS — N946 Dysmenorrhea, unspecified: Secondary | ICD-10-CM

## 2017-12-11 DIAGNOSIS — F17210 Nicotine dependence, cigarettes, uncomplicated: Secondary | ICD-10-CM | POA: Insufficient documentation

## 2017-12-11 DIAGNOSIS — N840 Polyp of corpus uteri: Secondary | ICD-10-CM | POA: Insufficient documentation

## 2017-12-11 DIAGNOSIS — Z8249 Family history of ischemic heart disease and other diseases of the circulatory system: Secondary | ICD-10-CM | POA: Diagnosis not present

## 2017-12-11 DIAGNOSIS — N72 Inflammatory disease of cervix uteri: Secondary | ICD-10-CM | POA: Diagnosis not present

## 2017-12-11 DIAGNOSIS — N920 Excessive and frequent menstruation with regular cycle: Principal | ICD-10-CM | POA: Insufficient documentation

## 2017-12-11 DIAGNOSIS — Z90710 Acquired absence of both cervix and uterus: Secondary | ICD-10-CM | POA: Diagnosis present

## 2017-12-11 DIAGNOSIS — Z880 Allergy status to penicillin: Secondary | ICD-10-CM | POA: Diagnosis not present

## 2017-12-11 DIAGNOSIS — N838 Other noninflammatory disorders of ovary, fallopian tube and broad ligament: Secondary | ICD-10-CM | POA: Diagnosis not present

## 2017-12-11 HISTORY — PX: LAPAROSCOPIC HYSTERECTOMY: SHX1926

## 2017-12-11 HISTORY — PX: CYSTOSCOPY: SHX5120

## 2017-12-11 LAB — CBC
HCT: 34.7 % — ABNORMAL LOW (ref 35.0–47.0)
Hemoglobin: 11.9 g/dL — ABNORMAL LOW (ref 12.0–16.0)
MCH: 31.2 pg (ref 26.0–34.0)
MCHC: 34.2 g/dL (ref 32.0–36.0)
MCV: 91.1 fL (ref 80.0–100.0)
PLATELETS: 329 10*3/uL (ref 150–440)
RBC: 3.81 MIL/uL (ref 3.80–5.20)
RDW: 12.7 % (ref 11.5–14.5)
WBC: 9.4 10*3/uL (ref 3.6–11.0)

## 2017-12-11 LAB — BASIC METABOLIC PANEL
Anion gap: 10 (ref 5–15)
BUN: 10 mg/dL (ref 6–20)
CHLORIDE: 106 mmol/L (ref 98–111)
CO2: 21 mmol/L — ABNORMAL LOW (ref 22–32)
Calcium: 8.5 mg/dL — ABNORMAL LOW (ref 8.9–10.3)
Creatinine, Ser: 0.65 mg/dL (ref 0.44–1.00)
GFR calc Af Amer: >60 mL/min (ref >60)
GLUCOSE: 186 mg/dL — AB (ref 70–99)
POTASSIUM: 4 mmol/L (ref 3.5–5.1)
Sodium: 137 mmol/L (ref 135–145)

## 2017-12-11 LAB — ABO/RH: ABO/RH(D): A POS

## 2017-12-11 LAB — POCT PREGNANCY, URINE: PREG TEST UR: NEGATIVE

## 2017-12-11 SURGERY — HYSTERECTOMY, TOTAL, LAPAROSCOPIC
Anesthesia: General | Wound class: Clean Contaminated

## 2017-12-11 MED ORDER — ACETAMINOPHEN 160 MG/5ML PO SOLN
325.0000 mg | ORAL | Status: DC | PRN
  Filled 2017-12-11: qty 20.3

## 2017-12-11 MED ORDER — PROPOFOL 10 MG/ML IV BOLUS
INTRAVENOUS | Status: AC
  Filled 2017-12-11: qty 20

## 2017-12-11 MED ORDER — ACETAMINOPHEN NICU IV SYRINGE 10 MG/ML
INTRAVENOUS | Status: AC
  Filled 2017-12-11: qty 1

## 2017-12-11 MED ORDER — MORPHINE SULFATE (PF) 2 MG/ML IV SOLN
1.0000 mg | INTRAVENOUS | Status: DC | PRN
  Administered 2017-12-11 (×2): 2 mg via INTRAVENOUS
  Filled 2017-12-11 (×2): qty 1

## 2017-12-11 MED ORDER — HYDROMORPHONE HCL 1 MG/ML IJ SOLN
INTRAMUSCULAR | Status: AC
Start: 2017-12-11 — End: ?
  Filled 2017-12-11: qty 1

## 2017-12-11 MED ORDER — LIDOCAINE HCL (CARDIAC) PF 100 MG/5ML IV SOSY
PREFILLED_SYRINGE | INTRAVENOUS | Status: DC | PRN
  Administered 2017-12-11: 50 mg via INTRAVENOUS

## 2017-12-11 MED ORDER — MIDAZOLAM HCL 5 MG/5ML IJ SOLN
INTRAMUSCULAR | Status: AC
Start: 2017-12-11 — End: ?
  Filled 2017-12-11: qty 5

## 2017-12-11 MED ORDER — PROMETHAZINE HCL 25 MG/ML IJ SOLN: 6.2500 mg | INTRAMUSCULAR | Status: DC | PRN

## 2017-12-11 MED ORDER — IPRATROPIUM-ALBUTEROL 0.5-2.5 (3) MG/3ML IN SOLN
RESPIRATORY_TRACT | Status: AC
  Filled 2017-12-11: qty 3

## 2017-12-11 MED ORDER — ROCURONIUM BROMIDE 50 MG/5ML IV SOLN
INTRAVENOUS | Status: AC
  Filled 2017-12-11: qty 2

## 2017-12-11 MED ORDER — PROMETHAZINE HCL 25 MG PO TABS
12.5000 mg | ORAL_TABLET | Freq: Four times a day (QID) | ORAL | Status: DC | PRN
  Filled 2017-12-11: qty 1

## 2017-12-11 MED ORDER — MENTHOL 3 MG MT LOZG
1.0000 | LOZENGE | OROMUCOSAL | Status: DC | PRN
  Filled 2017-12-11: qty 9

## 2017-12-11 MED ORDER — ROCURONIUM BROMIDE 100 MG/10ML IV SOLN
INTRAVENOUS | Status: DC | PRN
  Administered 2017-12-11: 50 mg via INTRAVENOUS
  Administered 2017-12-11: 20 mg via INTRAVENOUS

## 2017-12-11 MED ORDER — FENTANYL CITRATE (PF) 100 MCG/2ML IJ SOLN
INTRAMUSCULAR | Status: DC | PRN
  Administered 2017-12-11: 100 ug via INTRAVENOUS
  Administered 2017-12-11: 50 ug via INTRAVENOUS
  Administered 2017-12-11: 100 ug via INTRAVENOUS

## 2017-12-11 MED ORDER — LIDOCAINE HCL (PF) 2 % IJ SOLN
INTRAMUSCULAR | Status: AC
  Filled 2017-12-11: qty 10

## 2017-12-11 MED ORDER — KETAMINE HCL 50 MG/ML IJ SOLN
INTRAMUSCULAR | Status: AC
  Filled 2017-12-11: qty 10

## 2017-12-11 MED ORDER — BUPIVACAINE HCL (PF) 0.5 % IJ SOLN
INTRAMUSCULAR | Status: AC
  Filled 2017-12-11: qty 30

## 2017-12-11 MED ORDER — ALBUTEROL SULFATE HFA 108 (90 BASE) MCG/ACT IN AERS
INHALATION_SPRAY | RESPIRATORY_TRACT | Status: DC | PRN
  Administered 2017-12-11: 3 via RESPIRATORY_TRACT

## 2017-12-11 MED ORDER — FAMOTIDINE 20 MG PO TABS
ORAL_TABLET | ORAL | Status: AC
  Filled 2017-12-11: qty 1

## 2017-12-11 MED ORDER — HYDROMORPHONE HCL 1 MG/ML IJ SOLN
INTRAMUSCULAR | Status: DC | PRN
  Administered 2017-12-11 (×2): 1 mg via INTRAVENOUS

## 2017-12-11 MED ORDER — HYDROMORPHONE HCL 1 MG/ML IJ SOLN
INTRAMUSCULAR | Status: AC
  Administered 2017-12-11: 0.5 mg via INTRAVENOUS
  Filled 2017-12-11: qty 1

## 2017-12-11 MED ORDER — KETOROLAC TROMETHAMINE 30 MG/ML IJ SOLN: 30.0000 mg | Freq: Four times a day (QID) | INTRAMUSCULAR | Status: DC

## 2017-12-11 MED ORDER — ONDANSETRON HCL 4 MG/2ML IJ SOLN
INTRAMUSCULAR | Status: AC
  Filled 2017-12-11: qty 2

## 2017-12-11 MED ORDER — ONDANSETRON HCL 4 MG/2ML IJ SOLN
INTRAMUSCULAR | Status: DC | PRN
  Administered 2017-12-11 (×2): 4 mg via INTRAVENOUS

## 2017-12-11 MED ORDER — SUGAMMADEX SODIUM 200 MG/2ML IV SOLN
INTRAVENOUS | Status: AC
  Filled 2017-12-11: qty 2

## 2017-12-11 MED ORDER — MEPERIDINE HCL 50 MG/ML IJ SOLN: 6.2500 mg | INTRAMUSCULAR | Status: DC | PRN

## 2017-12-11 MED ORDER — SUGAMMADEX SODIUM 200 MG/2ML IV SOLN
INTRAVENOUS | Status: DC | PRN
  Administered 2017-12-11: 200 mg via INTRAVENOUS

## 2017-12-11 MED ORDER — PROPOFOL 500 MG/50ML IV EMUL
INTRAVENOUS | Status: AC
  Filled 2017-12-11: qty 50

## 2017-12-11 MED ORDER — ACETAMINOPHEN 325 MG PO TABS: 325.0000 mg | ORAL_TABLET | ORAL | Status: DC | PRN

## 2017-12-11 MED ORDER — FENTANYL CITRATE (PF) 250 MCG/5ML IJ SOLN
INTRAMUSCULAR | Status: AC
  Filled 2017-12-11: qty 5

## 2017-12-11 MED ORDER — KETOROLAC TROMETHAMINE 30 MG/ML IJ SOLN
INTRAMUSCULAR | Status: DC | PRN
  Administered 2017-12-11: 30 mg via INTRAVENOUS

## 2017-12-11 MED ORDER — HYDROMORPHONE HCL 1 MG/ML IJ SOLN
INTRAMUSCULAR | Status: AC
  Filled 2017-12-11: qty 1

## 2017-12-11 MED ORDER — HYDROCODONE-ACETAMINOPHEN 7.5-325 MG PO TABS: 1.0000 | ORAL_TABLET | Freq: Once | ORAL | Status: DC | PRN

## 2017-12-11 MED ORDER — KETOROLAC TROMETHAMINE 30 MG/ML IJ SOLN
INTRAMUSCULAR | Status: AC
  Filled 2017-12-11: qty 1

## 2017-12-11 MED ORDER — PROPOFOL 10 MG/ML IV BOLUS
INTRAVENOUS | Status: DC | PRN
  Administered 2017-12-11: 50 mg via INTRAVENOUS
  Administered 2017-12-11: 150 mg via INTRAVENOUS

## 2017-12-11 MED ORDER — HYDROMORPHONE HCL 1 MG/ML IJ SOLN: 0.2500 mg | INTRAMUSCULAR | Status: DC | PRN

## 2017-12-11 MED ORDER — KETAMINE HCL 10 MG/ML IJ SOLN
INTRAMUSCULAR | Status: DC | PRN
  Administered 2017-12-11: 20 mg via INTRAVENOUS
  Administered 2017-12-11: 30 mg via INTRAVENOUS

## 2017-12-11 MED ORDER — MIDAZOLAM HCL 2 MG/2ML IJ SOLN
INTRAMUSCULAR | Status: DC | PRN
  Administered 2017-12-11: 2 mg via INTRAVENOUS
  Administered 2017-12-11: 3 mg via INTRAVENOUS

## 2017-12-11 MED ORDER — HYDROMORPHONE HCL 1 MG/ML IJ SOLN
0.2500 mg | INTRAMUSCULAR | Status: DC | PRN
  Administered 2017-12-11 (×4): 0.5 mg via INTRAVENOUS

## 2017-12-11 MED ORDER — FAMOTIDINE 20 MG PO TABS
20.0000 mg | ORAL_TABLET | Freq: Once | ORAL | Status: AC
  Administered 2017-12-11: 20 mg via ORAL

## 2017-12-11 MED ORDER — KETOROLAC TROMETHAMINE 30 MG/ML IJ SOLN
30.0000 mg | Freq: Four times a day (QID) | INTRAMUSCULAR | Status: DC
  Administered 2017-12-11 – 2017-12-12 (×3): 30 mg via INTRAVENOUS
  Filled 2017-12-11 (×3): qty 1

## 2017-12-11 MED ORDER — SODIUM CHLORIDE 0.9 % IV BOLUS
1000.0000 mL | Freq: Once | INTRAVENOUS | Status: AC
  Administered 2017-12-11: 1000 mL via INTRAVENOUS

## 2017-12-11 MED ORDER — DEXAMETHASONE SODIUM PHOSPHATE 10 MG/ML IJ SOLN
INTRAMUSCULAR | Status: DC | PRN
  Administered 2017-12-11: 10 mg via INTRAVENOUS

## 2017-12-11 MED ORDER — BUPIVACAINE HCL 0.5 % IJ SOLN
INTRAMUSCULAR | Status: DC | PRN
  Administered 2017-12-11: 10 mL

## 2017-12-11 MED ORDER — ACETAMINOPHEN 160 MG/5ML PO SOLN: 325.0000 mg | ORAL | Status: DC | PRN

## 2017-12-11 MED ORDER — LACTATED RINGERS IV SOLN
INTRAVENOUS | Status: DC
  Administered 2017-12-11 (×2): via INTRAVENOUS

## 2017-12-11 MED ORDER — DEXAMETHASONE SODIUM PHOSPHATE 10 MG/ML IJ SOLN
INTRAMUSCULAR | Status: AC
Start: 2017-12-11 — End: ?
  Filled 2017-12-11: qty 1

## 2017-12-11 MED ORDER — DEXTROSE-NACL 5-0.45 % IV SOLN
INTRAVENOUS | Status: DC
  Administered 2017-12-11 – 2017-12-12 (×3): via INTRAVENOUS

## 2017-12-11 MED ORDER — ACETAMINOPHEN 10 MG/ML IV SOLN
INTRAVENOUS | Status: DC | PRN
  Administered 2017-12-11: 1000 mg via INTRAVENOUS

## 2017-12-11 MED ORDER — IPRATROPIUM-ALBUTEROL 0.5-2.5 (3) MG/3ML IN SOLN
3.0000 mL | Freq: Four times a day (QID) | RESPIRATORY_TRACT | Status: DC
  Administered 2017-12-11: 3 mL via RESPIRATORY_TRACT

## 2017-12-11 MED ORDER — GLYCOPYRROLATE 0.2 MG/ML IJ SOLN
INTRAMUSCULAR | Status: DC | PRN
  Administered 2017-12-11 (×2): 0.1 mg via INTRAVENOUS

## 2017-12-11 MED ORDER — ALBUTEROL SULFATE HFA 108 (90 BASE) MCG/ACT IN AERS
INHALATION_SPRAY | RESPIRATORY_TRACT | Status: AC
  Filled 2017-12-11: qty 6.7

## 2017-12-11 MED ORDER — PROMETHAZINE HCL 25 MG/ML IJ SOLN
12.5000 mg | Freq: Four times a day (QID) | INTRAMUSCULAR | Status: DC | PRN
  Administered 2017-12-11: 12.5 mg via INTRAVENOUS
  Filled 2017-12-11: qty 1

## 2017-12-11 MED ORDER — OXYCODONE-ACETAMINOPHEN 5-325 MG PO TABS
1.0000 | ORAL_TABLET | ORAL | Status: DC | PRN
  Administered 2017-12-12 (×2): 1 via ORAL
  Filled 2017-12-11 (×2): qty 1

## 2017-12-11 MED ORDER — DEXTROSE-NACL 5-0.45 % IV SOLN: INTRAVENOUS | Status: DC

## 2017-12-11 MED ORDER — ONDANSETRON HCL 4 MG/2ML IJ SOLN
4.0000 mg | Freq: Four times a day (QID) | INTRAMUSCULAR | Status: DC | PRN
  Administered 2017-12-11: 4 mg via INTRAVENOUS
  Filled 2017-12-11: qty 2

## 2017-12-11 MED ORDER — GLYCOPYRROLATE 0.2 MG/ML IJ SOLN
INTRAMUSCULAR | Status: AC
  Filled 2017-12-11: qty 1

## 2017-12-11 SURGICAL SUPPLY — 56 items
ADH SKN CLS APL DERMABOND .7 (GAUZE/BANDAGES/DRESSINGS) ×2
BAG URINE DRAINAGE (UROLOGICAL SUPPLIES) ×3 IMPLANT
BLADE SURG SZ11 CARB STEEL (BLADE) ×3 IMPLANT
CANISTER SUCT 1200ML W/VALVE (MISCELLANEOUS) ×3 IMPLANT
CATH FOLEY 2WAY  5CC 16FR (CATHETERS) ×2
CATH FOLEY 2WAY 5CC 16FR (CATHETERS) ×4
CATH URTH 16FR FL 2W BLN LF (CATHETERS) ×2 IMPLANT
CHLORAPREP W/TINT 26ML (MISCELLANEOUS) ×3 IMPLANT
COUNTER NEEDLE 20/40 LG (NEEDLE) ×3 IMPLANT
DEFOGGER SCOPE WARMER CLEARIFY (MISCELLANEOUS) ×3 IMPLANT
DERMABOND ADVANCED (GAUZE/BANDAGES/DRESSINGS) ×1
DERMABOND ADVANCED .7 DNX12 (GAUZE/BANDAGES/DRESSINGS) ×2 IMPLANT
DEVICE SUTURE ENDOST 10MM (ENDOMECHANICALS) ×1 IMPLANT
DRAPE CAMERA CLOSED 9X96 (DRAPES) ×1 IMPLANT
GLOVE BIO SURGEON STRL SZ 6.5 (GLOVE) ×1 IMPLANT
GLOVE BIO SURGEON STRL SZ7 (GLOVE) ×13 IMPLANT
GLOVE INDICATOR 7.5 STRL GRN (GLOVE) ×7 IMPLANT
GOWN STRL REUS W/ TWL LRG LVL3 (GOWN DISPOSABLE) ×4 IMPLANT
GOWN STRL REUS W/ TWL XL LVL3 (GOWN DISPOSABLE) ×2 IMPLANT
GOWN STRL REUS W/TWL LRG LVL3 (GOWN DISPOSABLE) ×6
GOWN STRL REUS W/TWL XL LVL3 (GOWN DISPOSABLE) ×3
IRRIGATION STRYKERFLOW (MISCELLANEOUS) ×2 IMPLANT
IRRIGATOR STRYKERFLOW (MISCELLANEOUS) ×3
IV LACTATED RINGERS 1000ML (IV SOLUTION) ×3 IMPLANT
IV NS 1000ML (IV SOLUTION) ×3
IV NS 1000ML BAXH (IV SOLUTION) ×2 IMPLANT
KIT PINK PAD W/HEAD ARE REST (MISCELLANEOUS) ×3
KIT PINK PAD W/HEAD ARM REST (MISCELLANEOUS) ×2 IMPLANT
LABEL OR SOLS (LABEL) ×3 IMPLANT
MANIPULATOR VCARE LG CRV RETR (MISCELLANEOUS) ×1 IMPLANT
MANIPULATOR VCARE SML CRV RETR (MISCELLANEOUS) IMPLANT
MANIPULATOR VCARE STD CRV RETR (MISCELLANEOUS) IMPLANT
NS IRRIG 1000ML POUR BTL (IV SOLUTION) ×3 IMPLANT
NS IRRIG 500ML POUR BTL (IV SOLUTION) ×3 IMPLANT
OCCLUDER COLPOPNEUMO (BALLOONS) IMPLANT
PACK GYN LAPAROSCOPIC (MISCELLANEOUS) ×3 IMPLANT
PAD OB MATERNITY 4.3X12.25 (PERSONAL CARE ITEMS) ×3 IMPLANT
PAD PREP 24X41 OB/GYN DISP (PERSONAL CARE ITEMS) ×3 IMPLANT
SCISSORS METZENBAUM CVD 33 (INSTRUMENTS) ×1 IMPLANT
SET CYSTO W/LG BORE CLAMP LF (SET/KITS/TRAYS/PACK) ×3 IMPLANT
SHEARS HARMONIC ACE PLUS 36CM (ENDOMECHANICALS) ×3 IMPLANT
SLEEVE ENDOPATH XCEL 5M (ENDOMECHANICALS) ×6 IMPLANT
SLEEVE PROTECTION STRL DISP (MISCELLANEOUS) ×1 IMPLANT
SPONGE LAP 18X18 RF (DISPOSABLE) ×3 IMPLANT
SPONGE XRAY 4X4 16PLY STRL (MISCELLANEOUS) ×3 IMPLANT
STRAP SAFETY 5IN WIDE (MISCELLANEOUS) ×3 IMPLANT
SURGILUBE 2OZ TUBE FLIPTOP (MISCELLANEOUS) ×3 IMPLANT
SUT ENDO VLOC 180-0-8IN (SUTURE) ×3 IMPLANT
SUT VIC AB 0 CT1 27 (SUTURE) ×3
SUT VIC AB 0 CT1 27XCR 8 STRN (SUTURE) ×2 IMPLANT
SUT VIC AB 2-0 UR6 27 (SUTURE) ×3 IMPLANT
SUT VIC AB 4-0 FS2 27 (SUTURE) ×6 IMPLANT
SYR 10ML LL (SYRINGE) ×3 IMPLANT
SYR 50ML LL SCALE MARK (SYRINGE) ×3 IMPLANT
TROCAR XCEL NON-BLD 5MMX100MML (ENDOMECHANICALS) ×3 IMPLANT
TUBING INSUF HEATED (TUBING) ×3 IMPLANT

## 2017-12-11 NOTE — Anesthesia Post-op Follow-up Note (Signed)
Anesthesia QCDR form completed.        

## 2017-12-11 NOTE — Anesthesia Procedure Notes (Signed)
Procedure Name: Intubation Date/Time: 12/11/2017 9:15 AM Performed by: Nile Riggs, CRNA Pre-anesthesia Checklist: Patient identified, Emergency Drugs available, Suction available, Patient being monitored and Timeout performed Patient Re-evaluated:Patient Re-evaluated prior to induction Oxygen Delivery Method: Circle system utilized Preoxygenation: Pre-oxygenation with 100% oxygen Induction Type: IV induction Ventilation: Mask ventilation without difficulty Laryngoscope Size: Miller and 2 Grade View: Grade I Tube type: Oral Tube size: 7.5 mm Number of attempts: 1 Airway Equipment and Method: Stylet Placement Confirmation: ETT inserted through vocal cords under direct vision,  positive ETCO2,  CO2 detector and breath sounds checked- equal and bilateral Secured at: 21 (@teeth ) cm Tube secured with: Tape Dental Injury: Teeth and Oropharynx as per pre-operative assessment

## 2017-12-11 NOTE — Transfer of Care (Signed)
Immediate Anesthesia Transfer of Care Note  Patient: Madison Adams  Procedure(s) Performed: HYSTERECTOMY TOTAL LAPAROSCOPIC, BILATERAL SALPINGECTOMY (Bilateral ) CYSTOSCOPY (N/A )  Patient Location: PACU  Anesthesia Type:General  Level of Consciousness: awake, alert , oriented and patient cooperative  Airway & Oxygen Therapy: Patient Spontanous Breathing and Patient connected to face mask oxygen  Post-op Assessment: Report given to RN, Post -op Vital signs reviewed and stable and Patient moving all extremities  Post vital signs: Reviewed and stable  Last Vitals:  Vitals Value Taken Time  BP 122/84 12/11/2017 11:32 AM  Temp    Pulse 78 12/11/2017 11:33 AM  Resp 16 12/11/2017 11:33 AM  SpO2 100 % 12/11/2017 11:33 AM  Vitals shown include unvalidated device data.  Last Pain:  Vitals:   12/11/17 0840  TempSrc: Oral  PainSc: 0-No pain         Complications: No apparent anesthesia complications

## 2017-12-11 NOTE — H&P (Signed)
Date of Initial H&P: 12/05/2017  History reviewed, patient examined, no change in status, stable for surgery.

## 2017-12-11 NOTE — Progress Notes (Signed)
Dr. Georgianne Fick notified urine output 42ml from straight cath. See new orders. Will continue to closely monitor.

## 2017-12-11 NOTE — Progress Notes (Signed)
Dr. Georgianne Fick notified patient's foley catheter bag had 200 ml upon arriving to unit but has not been draining and patient still having N/V. Verbal order to remove foley, straight cath, then do strict I/O. Will continue to closely monitor.

## 2017-12-11 NOTE — Op Note (Addendum)
Preoperative Diagnosis: 1) 47 y.o.  menorrhagia and dysmenorrhea  Postoperative Diagnosis: 1) 47 y.o.  menorrhagia and dysmenorrhea  Operation Performed: Total laparoscopic hysterectomy, bilateral salpingectomy, and cystoscopy  Indication: Menorrhagia and dysmenorrhea  Surgeon: Malachy Mood, MD  Assistant: Teresa Coombs MD high level surgery with no other skilled assistant available  Anesthesia: General  Preoperative Antibiotics: none  Estimated Blood Loss: 25 mL  IV Fluids: 1531mL  Urine Output:: 467mL  Drains or Tubes: none  Implants: none  Specimens Removed: Uterus, cervix, and bilateral fallopian tubes  Complications: none  Intraoperative Findings: Normal uterus, tubes, and uterus.  Bilateral efflux of urine from both ureteral orifices.   Patient Condition: stable  Procedure in Detail:  Patient was taken to the operating room where she was administered general anesthesia.  She was positioned in the dorsal lithotomy position utilizing Allen stirups, prepped and draped in the usual sterile fashion.  Prior to proceeding with procedure a time out was performed.  Attention was turned to the patient's pelvis.  An indwelling foley catheter was placed to decompress the patient's bladder.  An operative speculum was placed to allow visualization of the cervix.  The anterior lip of the cervix was grasped with a single tooth tenaculum, and a large V-care uterine manipulator was placed to allow manipulation of the uterus.  The operative speculum and single tooth tenaculum were then removed.  Attention was turned to the patient's abdomen.  The umbilicus was infiltrated with 1% Sensorcaine, before making a stab incision using an 11 blade scalpel at the site of the patient's prior umbilical hernia repair.  Fat was dissected of the fascia using a hemostat.  The fascia was grasped with a hemostat and re-grasped to ensure no underlying bowl was grasped.  The fascia was incised using an 11  blade scalpel.  The fascial edges were tagged with 0 Vicryl.  The peritoneum was entered bluntly using a hemostat.  An S-blade retractor was used to tent up the fascia and and peritoenum allowing placement of an 68mm Hassan port which was secured the abdomen using the previously placed fascial stitches.  Insufflation was started and pneumoperitoneum established at a pressure of 44mmHg.    One left and one right lower quadrant site were then injected with 1% Sensorcaine and a stab incision was made using an 11 blade scalpel.  Two additional 32mm Excel trocars were placed through these incisions under direct visualization. General inspection of the abdomen revealed the above noted findings.   The right tube was identified and grasped at its fimbriated end.  The tube was transected from its attachments to the ovary and mesosalpinx using a 40mm Harmonic scalpel.  The utero ovarian ligament was identified ligated and transected using the Harmonic scalpel. The round ligament was then likewise ligated and transected.  The anterior leaf of the broad ligament was dissected down to the level of the internal cervical os and a bladder flap was started.  The posterior leaf of the broad ligament was dissected down to the utero-sacral ligament.  The uterine artery was skeletonized before being ligated and transected using the Harmonic scalpel with cephelad pressure applied to the V-care device to assure lateralization of the ureter.  A bite was then taken with Harmonic medial to transected portio of uterine artery to further lateralize the ureter and vessel off the V-care cup.  The patient left adnexal structures were then dissected in similar fashion.  The bladder flap was completed and the bladder mobilized off the V-care cup.  An anterior colpotomy was scores and carried around in a clockwise fashion to free the specimen, which was then removed vaginally.  Inspection revealed all pedicles to be hemostatic before proceed with  vaginal closure.  A V-lock suture and endo stitch were used to start the vaginal closure.  However the needle dislodged on the 4th bite.  Needle and suture removed and approach changed to vaginal closure.     Attention was turned to the patient pelvis.  An operative speculum was placed and the anterior and posterior portion of the vaginal cuff were tagged with long Alice clamps.  The cuff was closed using interrupted figure of eight Vicryl stitches using 0 Vicryl.  The cuff was hemsotatic at the conclusion of closure without visible or palpable defects.  The indwelling foley catheter was removed.  Cystoscopy was performed noting and intact bladder dome as well as brisk efflux of urine from bother ureteral orifices.  The cystoscopy was removed and the indwelling foley catheter was replaced.  Pneumoperitoneum was re-established, the pelvis was irrigated and all pedicles were once again inspected and noted to be hemostatic.  The cuff appeared well closed and free of bowl or other tissue that may have inadvertently been incorporated into the vaginal closure.  Additional hemostatic agents were not applied to all pedicles.      Pneumoperitoneum was evacuated and trocars were removed.  All trocar sites were then dressed with surgical skin glue.  Sponge needle and instrument counts were correct time two.  The patient tolerated the procedure well and was taken to the recovery room in stable condition.

## 2017-12-11 NOTE — Anesthesia Preprocedure Evaluation (Addendum)
Anesthesia Evaluation  Patient identified by MRN, date of birth, ID band Patient awake    Reviewed: Allergy & Precautions, H&P , NPO status , reviewed documented beta blocker date and time   History of Anesthesia Complications (+) Family history of anesthesia reaction and history of anesthetic complications  Airway Mallampati: II  TM Distance: >3 FB Neck ROM: full    Dental  (+) Caps, Chipped   Pulmonary Current Smoker,   Very mild exp wheezes - given duoneb Pulmonary exam normal  + wheezing      Cardiovascular Normal cardiovascular exam+ Valvular Problems/Murmurs      Neuro/Psych    GI/Hepatic   Endo/Other    Renal/GU      Musculoskeletal   Abdominal   Peds  Hematology  (+) anemia ,   Anesthesia Other Findings Past Medical History: No date: Anemia     Comment:  WITH PREGNANCY ONLY 4967: Complication of anesthesia     Comment:  HARD TIME WAKING UP FOR 1ST BREAST SURGERY No date: Family history of adverse reaction to anesthesia     Comment:  MOM-N/V No date: Heart murmur     Comment:  AS A CHILD. 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 01-07-14: BREAST SURGERY; Right     Comment:  1 mm foci of atypical ductal hyperplasia, Gail model               risk 3.8%, 5 years; 34.5% lifetime. 05/02/2017: HYSTEROSCOPY W/D&C; N/A     Comment:  Procedure: DILATATION AND CURETTAGE /HYSTEROSCOPY;                Surgeon: Malachy Mood, MD;  Location: [facility] ORS;                Service: Gynecology;  Laterality: N/A; 01/14/96: TUBAL LIGATION 10/02/1636: UMBILICAL HERNIA REPAIR; N/A     Comment:  Procedure: HERNIA REPAIR UMBILICAL ADULT;  Surgeon:               Robert Bellow, MD;  Location: [facility] ORS;  Service:               General;  Laterality: N/A; No  date: WISDOM TOOTH EXTRACTION   Reproductive/Obstetrics                            Anesthesia Physical Anesthesia Plan  ASA: II  Anesthesia Plan: General ETT   Post-op Pain Management:    Induction: Intravenous  PONV Risk Score and Plan: 4 or greater and Ondansetron, Treatment may vary due to age or medical condition, Midazolam, Dexamethasone and Metaclopromide  Airway Management Planned:   Additional Equipment:   Intra-op Plan:   Post-operative Plan: Extubation in OR  Informed Consent: I have reviewed the patients History and Physical, chart, labs and discussed the procedure including the risks, benefits and alternatives for the proposed anesthesia with the patient or authorized representative who has indicated his/her understanding and acceptance.   Dental Advisory Given  Plan Discussed with: CRNA  Anesthesia Plan Comments:         Anesthesia Quick Evaluation

## 2017-12-12 DIAGNOSIS — N920 Excessive and frequent menstruation with regular cycle: Secondary | ICD-10-CM | POA: Diagnosis not present

## 2017-12-12 LAB — BASIC METABOLIC PANEL
ANION GAP: 4 — AB (ref 5–15)
BUN: 8 mg/dL (ref 6–20)
CHLORIDE: 112 mmol/L — AB (ref 98–111)
CO2: 26 mmol/L (ref 22–32)
Calcium: 8.3 mg/dL — ABNORMAL LOW (ref 8.9–10.3)
Creatinine, Ser: 0.53 mg/dL (ref 0.44–1.00)
GFR calc Af Amer: >60 mL/min (ref >60)
GFR calc non Af Amer: >60 mL/min (ref >60)
GLUCOSE: 149 mg/dL — AB (ref 70–99)
POTASSIUM: 3.8 mmol/L (ref 3.5–5.1)
SODIUM: 142 mmol/L (ref 135–145)

## 2017-12-12 LAB — SURGICAL PATHOLOGY

## 2017-12-12 LAB — CBC
HCT: 30.7 % — ABNORMAL LOW (ref 35.0–47.0)
Hemoglobin: 10.6 g/dL — ABNORMAL LOW (ref 12.0–16.0)
MCH: 31.5 pg (ref 26.0–34.0)
MCHC: 34.5 g/dL (ref 32.0–36.0)
MCV: 91.1 fL (ref 80.0–100.0)
Platelets: 273 10*3/uL (ref 150–440)
RBC: 3.37 MIL/uL — ABNORMAL LOW (ref 3.80–5.20)
RDW: 12.9 % (ref 11.5–14.5)
WBC: 8.1 10*3/uL (ref 3.6–11.0)

## 2017-12-12 MED ORDER — IBUPROFEN 800 MG PO TABS: 800.0000 mg | ORAL_TABLET | Freq: Three times a day (TID) | ORAL | 0 refills | Status: DC | PRN

## 2017-12-12 MED ORDER — OXYCODONE-ACETAMINOPHEN 5-325 MG PO TABS: 1.0000 | ORAL_TABLET | ORAL | 0 refills | Status: DC | PRN

## 2017-12-12 NOTE — Progress Notes (Signed)
Discharge instructions provided.  Pt and sig other verbalize understanding of all instructions and follow-up care.  Pt discharged to home at 1119 on 12/12/17 via wheelchair by volunteer. Reed Breech, RN 12/12/2017 11:20 AM

## 2017-12-12 NOTE — Discharge Summary (Signed)
Physician Discharge Summary  Patient ID: Madison Adams MRN: [id number] DOB/AGE: [birth year] 48 y.o.  Admit date: 12/11/2017 Discharge date: 12/12/2017  Admission Diagnoses: AUB, dysmenorrhea  Discharge Diagnoses:  Active Problems:   S/P laparoscopic hysterectomy   Discharged Condition: good  Hospital Course: 47 year old with AUB, dysmenorrhea presenting for scheduled TLH, BS, cysto.  Procedure uncomplicated.  Patient did well postoperatively with stable labs POD1.  Meeting all postoperative goals (remained hemodynamically stable, afebrile, tolerating po, voiding, good pain control, and ambulating)  Consults: None  Significant Diagnostic Studies:  Results for orders placed or performed during the hospital encounter of 12/11/17 (from the past 24 hour(s))  Pregnancy, urine POC     Status: None   Collection Time: 12/11/17  8:22 AM  Result Value Ref Range   Preg Test, Ur NEGATIVE NEGATIVE  ABO/Rh     Status: None   Collection Time: 12/11/17  8:27 AM  Result Value Ref Range   ABO/RH(D)      A POS Performed at [facility], [address] [place], [place] [address]   CBC     Status: Abnormal   Collection Time: 12/11/17  5:37 PM  Result Value Ref Range   WBC 9.4 3.6 - 11.0 K/uL   RBC 3.81 3.80 - 5.20 MIL/uL   Hemoglobin 11.9 (L) 12.0 - 16.0 g/dL   HCT 34.7 (L) 35.0 - 47.0 %   MCV 91.1 80.0 - 100.0 fL   MCH 31.2 26.0 - 34.0 pg   MCHC 34.2 32.0 - 36.0 g/dL   RDW 12.7 11.5 - 14.5 %   Platelets 329 150 - 440 K/uL  Basic metabolic panel     Status: Abnormal   Collection Time: 12/11/17  5:37 PM  Result Value Ref Range   Sodium 137 135 - 145 mmol/L   Potassium 4.0 3.5 - 5.1 mmol/L   Chloride 106 98 - 111 mmol/L   CO2 21 (L) 22 - 32 mmol/L   Glucose, Bld 186 (H) 70 - 99 mg/dL   BUN 10 6 - 20 mg/dL   Creatinine, Ser 0.65 0.44 - 1.00 mg/dL   Calcium 8.5 (L) 8.9 - 10.3 mg/dL   GFR calc non Af Amer >60 >60 mL/min   GFR calc Af Amer >60 >60 mL/min   Anion gap 10 5 -  15  CBC     Status: Abnormal   Collection Time: 12/12/17  5:30 AM  Result Value Ref Range   WBC 8.1 3.6 - 11.0 K/uL   RBC 3.37 (L) 3.80 - 5.20 MIL/uL   Hemoglobin 10.6 (L) 12.0 - 16.0 g/dL   HCT 30.7 (L) 35.0 - 47.0 %   MCV 91.1 80.0 - 100.0 fL   MCH 31.5 26.0 - 34.0 pg   MCHC 34.5 32.0 - 36.0 g/dL   RDW 12.9 11.5 - 14.5 %   Platelets 273 150 - 440 K/uL  Basic metabolic panel     Status: Abnormal   Collection Time: 12/12/17  5:30 AM  Result Value Ref Range   Sodium 142 135 - 145 mmol/L   Potassium 3.8 3.5 - 5.1 mmol/L   Chloride 112 (H) 98 - 111 mmol/L   CO2 26 22 - 32 mmol/L   Glucose, Bld 149 (H) 70 - 99 mg/dL   BUN 8 6 - 20 mg/dL   Creatinine, Ser 0.53 0.44 - 1.00 mg/dL   Calcium 8.3 (L) 8.9 - 10.3 mg/dL   GFR calc non Af Amer >60 >60 mL/min  GFR calc Af Amer >60 >60 mL/min   Anion gap 4 (L) 5 - 15     Treatments: surgery: TLH, BS, cysto  Discharge Exam: Blood pressure 110/64, pulse 77, temperature 98.1 F (36.7 C), temperature source Oral, resp. rate 18, height 5\' 2"  (1.575 m), weight 154 lb 8.7 oz (70.1 kg), last menstrual period 12/07/2017, SpO2 99 %. General appearance: alert, appears stated age and no distress Resp: clear to auscultation bilaterally GI: soft, non-tender; bowel sounds normal; no masses,  no organomegaly Incision/Wound: D/C/I  Disposition: Discharge disposition: 01-Home or Self Care       Discharge Instructions    Call MD for:   Complete by:  As directed    Heavy vaginal bleeding greater than 1 pad an hour   Call MD for:  difficulty breathing, headache or visual disturbances   Complete by:  As directed    Call MD for:  extreme fatigue   Complete by:  As directed    Call MD for:  hives   Complete by:  As directed    Call MD for:  persistant dizziness or light-headedness   Complete by:  As directed    Call MD for:  persistant nausea and vomiting   Complete by:  As directed    Call MD for:  redness, tenderness, or signs of infection  (pain, swelling, redness, odor or green/yellow discharge around incision site)   Complete by:  As directed    Call MD for:  severe uncontrolled pain   Complete by:  As directed    Call MD for:  temperature >100.4   Complete by:  As directed    Diet general   Complete by:  As directed    Discharge wound care:   Complete by:  As directed    You may apply a light dressing for minor discharge from the incision or to keep waistbands of clothing from rubbing.  You may also have been discharge with a clear dressing in which case this will be removed at your postoperative clinic visit.  You may shower, use soap on your incision.  Avoid baths or soaking the incision in the first 6 weeks following your surgery..   Driving restriction   Complete by:  As directed    Avoid driving for at least 2 weeks or while taking prescription narcotics.   Lifting restrictions   Complete by:  As directed    Weight restriction of 10lbs for 6 weeks.   Sexual acrtivity   Complete by:  As directed    No intercourse, tampons, or anything vaginally for 6 weeks     Allergies as of 12/12/2017      Reactions   Penicillins Anaphylaxis, Hives, Shortness Of Breath, Swelling, Other (See Comments)   Has patient had a PCN reaction causing immediate rash, facial/tongue/throat swelling, SOB or lightheadedness with hypotension: Yes Has patient had a PCN reaction causing severe rash involving mucus membranes or skin necrosis: Yes Has patient had a PCN reaction that required hospitalization:Treated in ER Has patient had a PCN reaction occurring within the last 10 years: No If all of the above answers are "NO", then may proceed with Cephalosporin use. Childhood allergy      Medication List    TAKE these medications   ibuprofen 800 MG tablet Commonly known as:  ADVIL,MOTRIN Take 1 tablet (800 mg total) by mouth every 8 (eight) hours as needed for moderate pain or cramping. What changed:    medication strength  how much  to  take  when to take this  reasons to take this   loratadine 10 MG tablet Commonly known as:  CLARITIN Take 10 mg by mouth daily as needed for allergies.   multivitamin with minerals Tabs tablet Take 1 tablet by mouth daily. Women's Multivitamin   oxyCODONE-acetaminophen 5-325 MG tablet Commonly known as:  PERCOCET/ROXICET Take 1 tablet by mouth every 4 (four) hours as needed for moderate pain or severe pain.   pseudoephedrine-acetaminophen 30-500 MG Tabs tablet Commonly known as:  TYLENOL SINUS Take 1 tablet by mouth every 4 (four) hours as needed.   raloxifene 60 MG tablet Commonly known as:  EVISTA Take 1 tablet (60 mg total) by mouth daily. What changed:  when to take this            Discharge Care Instructions  (From admission, onward)        Start     Ordered   12/12/17 0000  Discharge wound care:    Comments:  You may apply a light dressing for minor discharge from the incision or to keep waistbands of clothing from rubbing.  You may also have been discharge with a clear dressing in which case this will be removed at your postoperative clinic visit.  You may shower, use soap on your incision.  Avoid baths or soaking the incision in the first 6 weeks following your surgery.Marland Kitchen   12/12/17 0753     Follow-up Information    Malachy Mood, MD In 1 week.   Specialty:  Obstetrics and Gynecology Why:  incision check Contact information: [address] [place] [address] [phone]           Signed: Malachy Mood 12/12/2017, 7:53 AM

## 2017-12-12 NOTE — Discharge Instructions (Signed)
Call your doctor for increased pain or vaginal bleeding, temperature above 100.4, depression, or concerns.  Keep incisions clean and dry.  Call your doctor for incision concerns including redness, swelling, bleeding or drainage, or if begins to come apart.  No strenuous activity or heavy lifting for 6 weeks.  No intercourse, tampons, or douching for 6 weeks.  No tub baths- showers only.  No driving for 2 weeks or while taking pain medication.

## 2017-12-15 ENCOUNTER — Encounter (INDEPENDENT_AMBULATORY_CARE_PROVIDER_SITE_OTHER): Payer: Self-pay

## 2017-12-15 NOTE — Anesthesia Postprocedure Evaluation (Signed)
Anesthesia Post Note  Patient: Madison Adams  Procedure(s) Performed: HYSTERECTOMY TOTAL LAPAROSCOPIC, BILATERAL SALPINGECTOMY (Bilateral ) CYSTOSCOPY (N/A )  Patient location during evaluation: PACU Anesthesia Type: General Level of consciousness: awake and alert Pain management: pain level controlled Vital Signs Assessment: post-procedure vital signs reviewed and stable Respiratory status: spontaneous breathing, nonlabored ventilation and patient connected to nasal cannula oxygen Cardiovascular status: blood pressure returned to baseline and stable Postop Assessment: no apparent nausea or vomiting Anesthetic complications: no     Last Vitals:  Vitals:   12/12/17 0428 12/12/17 0723  BP: 108/70 110/64  Pulse: 67 77  Resp: 20 18  Temp: 37 C 36.7 C  SpO2: 99% 99%    Last Pain:  Vitals:   12/12/17 1030  TempSrc:   PainSc: 0-No pain                 Alphonsus Sias

## 2017-12-17 ENCOUNTER — Ambulatory Visit (INDEPENDENT_AMBULATORY_CARE_PROVIDER_SITE_OTHER): Payer: 59 | Admitting: Obstetrics and Gynecology

## 2017-12-17 ENCOUNTER — Encounter: Payer: Self-pay | Admitting: Obstetrics and Gynecology

## 2017-12-17 VITALS — BP 108/76 | HR 112 | Ht 62.0 in | Wt 153.0 lb

## 2017-12-17 DIAGNOSIS — Z4889 Encounter for other specified surgical aftercare: Secondary | ICD-10-CM

## 2017-12-17 NOTE — Progress Notes (Signed)
Postoperative Follow-up Patient presents post op from [place], BS, cystoscopy 1weeks ago for abnormal uterine bleeding.  Subjective: Patient reports marked improvement in her preop symptoms. Eating a regular diet without difficulty. Pain is controlled without any medications.  Activity: normal activities of daily living.  Objective: Blood pressure 108/76, pulse (!) 112, height 5' 2"  (1.575 m), weight 153 lb (69.4 kg), last menstrual period 12/07/2017.  Gen: NAD HEENT: normocephalic, anicteric Abdomen: incision D/C/I Ext: no edema  Admission on 12/11/2017, Discharged on 12/12/2017  Component Date Value Ref Range Status  . ABO/RH(D) 12/11/2017    Final                   Value:A POS Performed at [facility], [address] [place], [place] [address]   . Preg Test, Ur 12/11/2017 NEGATIVE  NEGATIVE Final   Comment:        THE SENSITIVITY OF THIS METHODOLOGY IS >24 mIU/mL   . SURGICAL PATHOLOGY 12/11/2017    Final                   Value:Surgical Pathology CASE: [id number] PATIENT: Aili Utt Surgical Pathology Report     SPECIMEN SUBMITTED: A. Uterus with cervix and bilateral tubes  CLINICAL HISTORY: None provided  PRE-OPERATIVE DIAGNOSIS: Abnormal uterine bleeding  POST-OPERATIVE DIAGNOSIS: Same as pre op     DIAGNOSIS: A.  UTERUS WITH CERVIX AND BILATERAL FALLOPIAN TUBES; HYSTERECTOMY WITH BILATERAL SALPINGECTOMY: - CHRONIC CERVICITIS. - SMALL BENIGN ENDOMETRIAL POLYP WITH TUBAL METAPLASIA, 0.3 CM. - BACKGROUND SECRETORY ENDOMETRIUM WITH TUBAL METAPLASIA, NEGATIVE FOR ATYPIA. - BILATERAL FALLOPIAN TUBES WITH VASCULAR CONGESTION. - PARATUBAL CYST OF THE LEFT FALLOPIAN TUBE.   GROSS DESCRIPTION: A. Labeled: Uterus with cervix and bilateral tubes Received: In formalin Weight: 96 grams Dimensions:      Fundus -5.2 x 4.7 x 3.7 cm      Cervix -3.0 x 2.9 cm with an external os of 0.9 cm Serosa: Pink-tan with focal fibrous  adhesion Cervix: Smooth pink Endocervix: Trabecular                          pink-tan Endometrial cavity:      Dimensions -2.9 x 2.6 cm      Thickness -0.1 cm      Other findings -slightly raised 0.3 x 0.3 by less than 0.1 cm area at the fundus posterior Myometrium:     Thickness -1.9 cm     Other findings -none noted Adnexa:       Right fallopian tube           Measurements -2.9 cm in length x 1.4 cm in diameter           Other findings -purple-tan fimbriated, discontinuous      Left fallopian tube            Measurements -3.1 cm in length x 1.6 cm in diameter           Other findings -purple-tan fimbriated, discontinuous with a 0.6 cm in diameter paratubal cyst Other comments: No additional noted  Block summary: 1 - representative anterior cervix 2 - representative posterior cervix 3 - entire slightly raised?  Polyp at the fundus 4 - representative anterior endomyometrium 5 - representative posterior endomyometrium 6 - representative cross-section and longitudinal fimbriated and right fallopian tube 7 - representative cross-section  and longitudinal fimbriated end left fallopian tube with paratubal cyst  Final Diagnosis performed by Delorse Lek, MD.   Electronically signed 12/12/2017 3:36:28PM The electronic signature indicates that the named Attending Pathologist has evaluated the specimen  Technical component performed at [facility], [address], [place], [place] [address] Lab: [phone] Dir: Rush Farmer, MD, MMM  Professional component performed at [facility], [facility], [place], [place], [place] [address] Lab: [phone] Dir: Dellia Nims. Rubinas, MD   . WBC 12/12/2017 8.1  3.6 - 11.0 K/uL Final  . RBC 12/12/2017 3.37* 3.80 - 5.20 MIL/uL Final  . Hemoglobin 12/12/2017 10.6* 12.0 - 16.0 g/dL Final  . HCT 12/12/2017 30.7* 35.0 - 47.0 % Final  . MCV 12/12/2017 91.1  80.0 - 100.0 fL Final  . MCH 12/12/2017 31.5  26.0  - 34.0 pg Final  . MCHC 12/12/2017 34.5  32.0 - 36.0 g/dL Final  . RDW 12/12/2017 12.9  11.5 - 14.5 % Final  . Platelets 12/12/2017 273  150 - 440 K/uL Final   Performed at [facility], [address] [place], [place] [address]  . Sodium 12/12/2017 142  135 - 145 mmol/L Final  . Potassium 12/12/2017 3.8  3.5 - 5.1 mmol/L Final  . Chloride 12/12/2017 112* 98 - 111 mmol/L Final   Please note change in reference range.  . CO2 12/12/2017 26  22 - 32 mmol/L Final  . Glucose, Bld 12/12/2017 149* 70 - 99 mg/dL Final   Please note change in reference range.  . BUN 12/12/2017 8  6 - 20 mg/dL Final   Please note change in reference range.  . Creatinine, Ser 12/12/2017 0.53  0.44 - 1.00 mg/dL Final  . Calcium 12/12/2017 8.3* 8.9 - 10.3 mg/dL Final  . GFR calc non Af Amer 12/12/2017 >60  >60 mL/min Final  . GFR calc Af Amer 12/12/2017 >60  >60 mL/min Final   Comment: (NOTE) The eGFR has been calculated using the CKD EPI equation. This calculation has not been validated in all clinical situations. eGFR's persistently <60 mL/min signify possible Chronic Kidney Disease.   Georgiann Hahn gap 12/12/2017 4* 5 - 15 Final   Performed at [facility], [address] [place], [place] [address]  . WBC 12/11/2017 9.4  3.6 - 11.0 K/uL Final  . RBC 12/11/2017 3.81  3.80 - 5.20 MIL/uL Final  . Hemoglobin 12/11/2017 11.9* 12.0 - 16.0 g/dL Final  . HCT 12/11/2017 34.7* 35.0 - 47.0 % Final  . MCV 12/11/2017 91.1  80.0 - 100.0 fL Final  . MCH 12/11/2017 31.2  26.0 - 34.0 pg Final  . MCHC 12/11/2017 34.2  32.0 - 36.0 g/dL Final  . RDW 12/11/2017 12.7  11.5 - 14.5 % Final  . Platelets 12/11/2017 329  150 - 440 K/uL Final   Performed at [facility], [address], [place], [place] [address]  . Sodium 12/11/2017 137  135 - 145 mmol/L Final  . Potassium 12/11/2017 4.0  3.5 - 5.1 mmol/L Final  . Chloride 12/11/2017 106  98 - 111 mmol/L Final   Please note change in reference range.  .  CO2 12/11/2017 21* 22 - 32 mmol/L Final  . Glucose, Bld 12/11/2017 186* 70 - 99 mg/dL Final   Please note change in reference range.  . BUN 12/11/2017 10  6 - 20 mg/dL Final   Please note change in reference range.  . Creatinine, Ser 12/11/2017 0.65  0.44 - 1.00 mg/dL Final  .  Calcium 12/11/2017 8.5* 8.9 - 10.3 mg/dL Final  . GFR calc non Af Amer 12/11/2017 >60  >60 mL/min Final  . GFR calc Af Amer 12/11/2017 >60  >60 mL/min Final   Comment: (NOTE) The eGFR has been calculated using the CKD EPI equation. This calculation has not been validated in all clinical situations. eGFR's persistently <60 mL/min signify possible Chronic Kidney Disease.   Georgiann Hahn gap 12/11/2017 10  5 - 15 Final   Performed at [facility], [address] [place], [place] [address]    Assessment: 47 y.o. s/p TLH, BS, cystoscopy stable  Plan: Patient has done well after surgery with no apparent complications.  I have discussed the post-operative course to date, and the expected progress moving forward.  The patient understands what complications to be concerned about.  I will see the patient in routine follow up, or sooner if needed.    Activity plan: No heavy lifting, pelvic rest  Return in about 5 weeks (around 01/21/2018) for postop.    Malachy Mood, MD, [facility], [facility] 12/17/2017, 10:53 AM

## 2018-01-11 ENCOUNTER — Other Ambulatory Visit: Payer: Self-pay | Admitting: General Surgery

## 2018-01-19 ENCOUNTER — Ambulatory Visit (INDEPENDENT_AMBULATORY_CARE_PROVIDER_SITE_OTHER): Payer: 59 | Admitting: Obstetrics and Gynecology

## 2018-01-19 ENCOUNTER — Encounter: Payer: Self-pay | Admitting: Obstetrics and Gynecology

## 2018-01-19 VITALS — BP 118/80 | HR 90 | Ht 62.0 in | Wt 150.0 lb

## 2018-01-19 DIAGNOSIS — Z4889 Encounter for other specified surgical aftercare: Secondary | ICD-10-CM

## 2018-01-19 NOTE — Progress Notes (Signed)
Postoperative Follow-up Patient presents post op from [place], BS, cystoscopy 6weeks ago for abnormal uterine bleeding.  Subjective: Patient reports marked improvement in her preop symptoms. Eating a regular diet without difficulty. The patient is not having any pain.  Activity: normal activities of daily living.  No bleeding, fevers, chills.  No bowl or bladder issues  Objective: Blood pressure 118/80, pulse 90, height 5' 2"  (1.575 m), weight 150 lb (68 kg), last menstrual period 12/07/2017.  Gen: NAD HEENT: normocephalic, anicteric Pulmonary: no increased work of breathing Abdomen: soft, non-tender, non-distended, trocar sites D/C/I GU: normal external female genitalia, normal vaginal mucosa, no bleeding.  Sutures still palpable cuff intact, some granulation tissue noted.  Ext: no edema  Admission on 12/11/2017, Discharged on 12/12/2017  Component Date Value Ref Range Status  . ABO/RH(D) 12/11/2017    Final                   Value:A POS Performed at [facility], [address] [place], [place] [address]   . Preg Test, Ur 12/11/2017 NEGATIVE  NEGATIVE Final   Comment:        THE SENSITIVITY OF THIS METHODOLOGY IS >24 mIU/mL   . SURGICAL PATHOLOGY 12/11/2017    Final                   Value:Surgical Pathology CASE: [id number] PATIENT: Dicy Gherardi Surgical Pathology Report     SPECIMEN SUBMITTED: A. Uterus with cervix and bilateral tubes  CLINICAL HISTORY: None provided  PRE-OPERATIVE DIAGNOSIS: Abnormal uterine bleeding  POST-OPERATIVE DIAGNOSIS: Same as pre op     DIAGNOSIS: A.  UTERUS WITH CERVIX AND BILATERAL FALLOPIAN TUBES; HYSTERECTOMY WITH BILATERAL SALPINGECTOMY: - CHRONIC CERVICITIS. - SMALL BENIGN ENDOMETRIAL POLYP WITH TUBAL METAPLASIA, 0.3 CM. - BACKGROUND SECRETORY ENDOMETRIUM WITH TUBAL METAPLASIA, NEGATIVE FOR ATYPIA. - BILATERAL FALLOPIAN TUBES WITH VASCULAR CONGESTION. - PARATUBAL CYST OF THE LEFT FALLOPIAN  TUBE.   GROSS DESCRIPTION: A. Labeled: Uterus with cervix and bilateral tubes Received: In formalin Weight: 96 grams Dimensions:      Fundus -5.2 x 4.7 x 3.7 cm      Cervix -3.0 x 2.9 cm with an external os of 0.9 cm Serosa: Pink-tan with focal fibrous adhesion Cervix: Smooth pink Endocervix: Trabecular                          pink-tan Endometrial cavity:      Dimensions -2.9 x 2.6 cm      Thickness -0.1 cm      Other findings -slightly raised 0.3 x 0.3 by less than 0.1 cm area at the fundus posterior Myometrium:     Thickness -1.9 cm     Other findings -none noted Adnexa:       Right fallopian tube           Measurements -2.9 cm in length x 1.4 cm in diameter           Other findings -purple-tan fimbriated, discontinuous      Left fallopian tube            Measurements -3.1 cm in length x 1.6 cm in diameter           Other findings -purple-tan fimbriated, discontinuous with a 0.6 cm in diameter paratubal cyst Other comments: No additional noted  Block summary: 1 - representative anterior cervix 2 - representative posterior cervix 3 - entire slightly raised?  Polyp at  the fundus 4 - representative anterior endomyometrium 5 - representative posterior endomyometrium 6 - representative cross-section and longitudinal fimbriated and right fallopian tube 7 - representative cross-section                          and longitudinal fimbriated end left fallopian tube with paratubal cyst  Final Diagnosis performed by Delorse Lek, MD.   Electronically signed 12/12/2017 3:36:28PM The electronic signature indicates that the named Attending Pathologist has evaluated the specimen  Technical component performed at [facility], [address], [place], [place] [address] Lab: [phone] Dir: Rush Farmer, MD, MMM  Professional component performed at [facility], [facility], [place], [place], [place] [address] Lab: [phone] Dir: Dellia Nims. Rubinas, MD   . WBC  12/12/2017 8.1  3.6 - 11.0 K/uL Final  . RBC 12/12/2017 3.37* 3.80 - 5.20 MIL/uL Final  . Hemoglobin 12/12/2017 10.6* 12.0 - 16.0 g/dL Final  . HCT 12/12/2017 30.7* 35.0 - 47.0 % Final  . MCV 12/12/2017 91.1  80.0 - 100.0 fL Final  . MCH 12/12/2017 31.5  26.0 - 34.0 pg Final  . MCHC 12/12/2017 34.5  32.0 - 36.0 g/dL Final  . RDW 12/12/2017 12.9  11.5 - 14.5 % Final  . Platelets 12/12/2017 273  150 - 440 K/uL Final   Performed at [facility], [address], [place], [place] [address]  . Sodium 12/12/2017 142  135 - 145 mmol/L Final  . Potassium 12/12/2017 3.8  3.5 - 5.1 mmol/L Final  . Chloride 12/12/2017 112* 98 - 111 mmol/L Final   Please note change in reference range.  . CO2 12/12/2017 26  22 - 32 mmol/L Final  . Glucose, Bld 12/12/2017 149* 70 - 99 mg/dL Final   Please note change in reference range.  . BUN 12/12/2017 8  6 - 20 mg/dL Final   Please note change in reference range.  . Creatinine, Ser 12/12/2017 0.53  0.44 - 1.00 mg/dL Final  . Calcium 12/12/2017 8.3* 8.9 - 10.3 mg/dL Final  . GFR calc non Af Amer 12/12/2017 >60  >60 mL/min Final  . GFR calc Af Amer 12/12/2017 >60  >60 mL/min Final   Comment: (NOTE) The eGFR has been calculated using the CKD EPI equation. This calculation has not been validated in all clinical situations. eGFR's persistently <60 mL/min signify possible Chronic Kidney Disease.   Georgiann Hahn gap 12/12/2017 4* 5 - 15 Final   Performed at [facility], [address] [place], [place] [address]  . WBC 12/11/2017 9.4  3.6 - 11.0 K/uL Final  . RBC 12/11/2017 3.81  3.80 - 5.20 MIL/uL Final  . Hemoglobin 12/11/2017 11.9* 12.0 - 16.0 g/dL Final  . HCT 12/11/2017 34.7* 35.0 - 47.0 % Final  . MCV 12/11/2017 91.1  80.0 - 100.0 fL Final  . MCH 12/11/2017 31.2  26.0 - 34.0 pg Final  . MCHC 12/11/2017 34.2  32.0 - 36.0 g/dL Final  . RDW 12/11/2017 12.7  11.5 - 14.5 % Final  . Platelets 12/11/2017 329  150 - 440 K/uL Final   Performed at  [facility], [address], [place], [place] [address]  . Sodium 12/11/2017 137  135 - 145 mmol/L Final  . Potassium 12/11/2017 4.0  3.5 - 5.1 mmol/L Final  . Chloride 12/11/2017 106  98 - 111 mmol/L Final   Please note change in reference range.  . CO2 12/11/2017 21* 22 - 32 mmol/L  Final  . Glucose, Bld 12/11/2017 186* 70 - 99 mg/dL Final   Please note change in reference range.  . BUN 12/11/2017 10  6 - 20 mg/dL Final   Please note change in reference range.  . Creatinine, Ser 12/11/2017 0.65  0.44 - 1.00 mg/dL Final  . Calcium 12/11/2017 8.5* 8.9 - 10.3 mg/dL Final  . GFR calc non Af Amer 12/11/2017 >60  >60 mL/min Final  . GFR calc Af Amer 12/11/2017 >60  >60 mL/min Final   Comment: (NOTE) The eGFR has been calculated using the CKD EPI equation. This calculation has not been validated in all clinical situations. eGFR's persistently <60 mL/min signify possible Chronic Kidney Disease.   Georgiann Hahn gap 12/11/2017 10  5 - 15 Final   Performed at [facility], [address] [place], [place] [address]    Assessment: 47 y.o. s/p TLH, BS, cystoscopy stable  Plan: Patient has done well after surgery with no apparent complications.  I have discussed the post-operative course to date, and the expected progress moving forward.  The patient understands what complications to be concerned about.  I will see the patient in routine follow up, or sooner if needed.    Activity plan: may return to work, pelvic rest for additional 2 weeks for total 8 week recovery time  Return in about 1 year (around 01/20/2019) for annual.    Malachy Mood, MD, [facility] 01/19/2018, 10:58 AM

## 2018-01-22 ENCOUNTER — Ambulatory Visit: Payer: 59 | Admitting: Obstetrics and Gynecology

## 2018-01-27 ENCOUNTER — Other Ambulatory Visit: Payer: Self-pay | Admitting: Obstetrics and Gynecology

## 2018-03-09 ENCOUNTER — Telehealth: Payer: Self-pay

## 2018-03-09 NOTE — Telephone Encounter (Signed)
Pt called stating she had a hysterectomy with [place]ae[place] and is starting to experience hot flashes. She is wanting to know if there is an OTC she can use to help it. Please advise. Thank you

## 2018-03-09 NOTE — Telephone Encounter (Signed)
No OTC that has been shown to be effective in clinical trial although some people do report improvement on [facility]

## 2018-03-09 NOTE — Telephone Encounter (Signed)
Pt aware.

## 2018-03-09 NOTE — Telephone Encounter (Signed)
Please advise 

## 2018-07-29 ENCOUNTER — Other Ambulatory Visit: Payer: Self-pay

## 2018-07-29 DIAGNOSIS — N6091 Unspecified benign mammary dysplasia of right breast: Secondary | ICD-10-CM

## 2018-08-13 ENCOUNTER — Other Ambulatory Visit: Payer: Self-pay

## 2018-08-13 DIAGNOSIS — Z1231 Encounter for screening mammogram for malignant neoplasm of breast: Secondary | ICD-10-CM

## 2018-10-06 ENCOUNTER — Ambulatory Visit: Payer: 59 | Admitting: General Surgery

## 2018-10-30 ENCOUNTER — Other Ambulatory Visit: Payer: Self-pay

## 2018-10-30 ENCOUNTER — Ambulatory Visit
Admission: RE | Admit: 2018-10-30 | Discharge: 2018-10-30 | Disposition: A | Discharge status: Home / Self Care | Payer: 59 | Source: Ambulatory Visit | Attending: General Surgery | Admitting: General Surgery

## 2018-10-30 DIAGNOSIS — Z1231 Encounter for screening mammogram for malignant neoplasm of breast: Secondary | ICD-10-CM | POA: Diagnosis present

## 2018-11-02 ENCOUNTER — Other Ambulatory Visit: Payer: Self-pay | Admitting: General Surgery

## 2018-11-02 DIAGNOSIS — N6310 Unspecified lump in the right breast, unspecified quadrant: Secondary | ICD-10-CM

## 2018-11-02 DIAGNOSIS — R928 Other abnormal and inconclusive findings on diagnostic imaging of breast: Secondary | ICD-10-CM

## 2018-11-04 ENCOUNTER — Telehealth: Payer: Self-pay | Admitting: General Surgery

## 2018-11-04 NOTE — Telephone Encounter (Signed)
Patient is calling and is schedule for an appointment with you tomorrow at 2:15, [facility] called the patient and would like to have more images done , they couldn't get her in until 11/09/18. Would you like to see the patient before more images, or wait please advise.

## 2018-11-05 ENCOUNTER — Ambulatory Visit: Payer: 59 | Admitting: General Surgery

## 2018-11-05 ENCOUNTER — Encounter: Payer: Self-pay | Admitting: General Surgery

## 2018-11-05 ENCOUNTER — Ambulatory Visit (INDEPENDENT_AMBULATORY_CARE_PROVIDER_SITE_OTHER): Payer: 59 | Admitting: General Surgery

## 2018-11-05 ENCOUNTER — Other Ambulatory Visit: Payer: Self-pay

## 2018-11-05 VITALS — Ht 62.0 in | Wt 154.6 lb

## 2018-11-05 DIAGNOSIS — N6091 Unspecified benign mammary dysplasia of right breast: Secondary | ICD-10-CM | POA: Diagnosis not present

## 2018-11-05 NOTE — Patient Instructions (Addendum)
Please call our office after you have your additional views on Monday.   We will contact you to schedule your Bilateral diagnostic mammogram and follow up with Dr.Byrnett for June 2021.    Umbilical Hernia, Adult  A hernia is a bulge of tissue that pushes through an opening between muscles. An umbilical hernia happens in the abdomen, near the belly button (umbilicus). The hernia may contain tissues from the small intestine, large intestine, or fatty tissue covering the intestines (omentum). Umbilical hernias in adults tend to get worse over time, and they require surgical treatment. There are several types of umbilical hernias. You may have:  A hernia located just above or below the umbilicus (indirect hernia). This is the most common type of umbilical hernia in adults.  A hernia that forms through an opening formed by the umbilicus (direct hernia).  A hernia that comes and goes (reducible hernia). A reducible hernia may be visible only when you strain, lift something heavy, or cough. This type of hernia can be pushed back into the abdomen (reduced).  A hernia that traps abdominal tissue inside the hernia (incarcerated hernia). This type of hernia cannot be reduced.  A hernia that cuts off blood flow to the tissues inside the hernia (strangulated hernia). The tissues can start to die if this happens. This type of hernia requires emergency treatment. What are the causes? An umbilical hernia happens when tissue inside the abdomen presses on a weak area of the abdominal muscles. What increases the risk? You may have a greater risk of this condition if you:  Are obese.  Have had several pregnancies.  Have a buildup of fluid inside your abdomen (ascites).  Have had surgery that weakens the abdominal muscles. What are the signs or symptoms? The main symptom of this condition is a painless bulge at or near the belly button. A reducible hernia may be visible only when you strain, lift  something heavy, or cough. Other symptoms may include:  Dull pain.  A feeling of pressure. Symptoms of a strangulated hernia may include:  Pain that gets increasingly worse.  Nausea and vomiting.  Pain when pressing on the hernia.  Skin over the hernia becoming red or purple.  Constipation.  Blood in the stool. How is this diagnosed? This condition may be diagnosed based on:  A physical exam. You may be asked to cough or strain while standing. These actions increase the pressure inside your abdomen and force the hernia through the opening in your muscles. Your health care provider may try to reduce the hernia by pressing on it.  Your symptoms and medical history. How is this treated? Surgery is the only treatment for an umbilical hernia. Surgery for a strangulated hernia is done as soon as possible. If you have a small hernia that is not incarcerated, you may need to lose weight before having surgery. Follow these instructions at home:  Lose weight, if told by your health care provider.  Do not try to push the hernia back in.  Watch your hernia for any changes in color or size. Tell your health care provider if any changes occur.  You may need to avoid activities that increase pressure on your hernia.  Do not lift anything that is heavier than 10 lb (4.5 kg) until your health care provider says that this is safe.  Take over-the-counter and prescription medicines only as told by your health care provider.  Keep all follow-up visits as told by your health care provider. This  is important. Contact a health care provider if:  Your hernia gets larger.  Your hernia becomes painful. Get help right away if:  You develop sudden, severe pain near the area of your hernia.  You have pain as well as nausea or vomiting.  You have pain and the skin over your hernia changes color.  You develop a fever. This information is not intended to replace advice given to you by your  health care provider. Make sure you discuss any questions you have with your health care provider. Document Released: 10/13/2015 Document Revised: 06/25/2017 Document Reviewed: 11/11/2016 Elsevier Interactive Patient Education  2019 [employer].

## 2018-11-05 NOTE — Progress Notes (Signed)
Patient ID: Madison Adams, female   DOB: 09/29/1970, 48 y.o.   MRN: [id number]  Chief Complaint  Patient presents with  . Follow-up    [place] screening mammogram    HPI Madison Adams is a 48 y.o. female.  Here today for bilateral screening mammogram. She is scheduled 11/09/2018 for additional views of the right breast. Patient states she does not feel a lump, no nipple drainage, or skin changes.  HPI  Past Medical History:  Diagnosis Date  . Anemia    WITH PREGNANCY ONLY  . Complication of anesthesia 2015   HARD TIME WAKING UP FOR 1ST BREAST SURGERY  . Family history of adverse reaction to anesthesia    MOM-N/V  . Heart murmur    AS A CHILD. no one follows it  . Hematuria, microscopic   . Ovarian cyst   . Pyelonephritis     Past Surgical History:  Procedure Laterality Date  . BREAST BIOPSY Right 10-28-13   FOCUS OF ATYPICAL DUCTAL HYPERPLASIA, 1 MM.  Marland Kitchen BREAST SURGERY Right 01-07-14   1 mm foci of atypical ductal hyperplasia, Gail model risk 3.8%, 5 years; 34.5% lifetime.  . CYSTOSCOPY N/A 12/11/2017   Procedure: CYSTOSCOPY;  Surgeon: Malachy Mood, MD;  Location: [facility];  Service: Gynecology;  Laterality: N/A;  . HYSTEROSCOPY W/D&C N/A 05/02/2017   Procedure: DILATATION AND CURETTAGE /HYSTEROSCOPY;  Surgeon: Malachy Mood, MD;  Location: [facility];  Service: Gynecology;  Laterality: N/A;  . LAPAROSCOPIC HYSTERECTOMY Bilateral 12/11/2017   Procedure: HYSTERECTOMY TOTAL LAPAROSCOPIC, BILATERAL SALPINGECTOMY;  Surgeon: Malachy Mood, MD;  Location: [facility];  Service: Gynecology;  Laterality: Bilateral;  . TUBAL LIGATION  01/14/96  . UMBILICAL HERNIA REPAIR N/A 10/30/2016   Procedure: HERNIA REPAIR UMBILICAL ADULT;  Surgeon: Robert Bellow, MD;  Location: [facility];  Service: General;  Laterality: N/A;  . WISDOM TOOTH EXTRACTION      Family History  Problem Relation Age of Onset  . Breast cancer Mother 89  . Healthy Father   . Heart failure Paternal Grandfather  74    Social History Social History   Tobacco Use  . Smoking status: Current Every Day Smoker    Packs/day: 0.50    Years: 11.00    Pack years: 5.50    Types: Cigarettes    Start date: 01/16/1997  . Smokeless tobacco: Never Used  Substance Use Topics  . Alcohol use: No    Alcohol/week: 0.0 standard drinks  . Drug use: No    Allergies  Allergen Reactions  . Penicillins Anaphylaxis, Hives, Shortness Of Breath, Swelling and Other (See Comments)    Has patient had a PCN reaction causing immediate rash, facial/tongue/throat swelling, SOB or lightheadedness with hypotension: Yes Has patient had a PCN reaction causing severe rash involving mucus membranes or skin necrosis: Yes Has patient had a PCN reaction that required hospitalization:Treated in ER Has patient had a PCN reaction occurring within the last 10 years: No If all of the above answers are "NO", then may proceed with Cephalosporin use. Childhood allergy    Current Outpatient Medications  Medication Sig Dispense Refill  . ibuprofen (ADVIL,MOTRIN) 800 MG tablet TAKE 1 TABLET BY MOUTH EVERY 8 HOURS AS NEEDED FOR MODERATE PAIN OR CRAMPING 40 tablet 0  . Multiple Vitamin (MULTIVITAMIN WITH MINERALS) TABS tablet Take 1 tablet by mouth daily. Women's Multivitamin     No current facility-administered medications for this visit.     Review of Systems Review of Systems  Constitutional: Negative.  Respiratory: Negative.   Cardiovascular: Negative.     Height 5\' 2"  (1.575 m), weight 154 lb 9.6 oz (70.1 kg), last menstrual period 12/07/2017.  Physical Exam Physical Exam Constitutional:      Appearance: She is well-developed.  Eyes:     General: No scleral icterus.    Conjunctiva/sclera: Conjunctivae normal.  Neck:     Musculoskeletal: Normal range of motion.  Cardiovascular:     Rate and Rhythm: Normal rate and regular rhythm.     Heart sounds: Normal heart sounds.  Pulmonary:     Effort: Pulmonary effort is  normal.     Breath sounds: Normal breath sounds.  Chest:    Abdominal:    Lymphadenopathy:     Cervical: No cervical adenopathy.     Upper Body:     Right upper body: No supraclavicular or axillary adenopathy.     Left upper body: No supraclavicular or axillary adenopathy.  Skin:    General: Skin is warm and dry.  Neurological:     Mental Status: She is alert and oriented to person, place, and time.     Data Reviewed Screening mammograms dated November 09, 2018 were individually reviewed.  There is a spherical density best appreciated on the cc view of the right breast.  Additional views and ultrasound have been scheduled for next week at [facility] and will be reviewed when completed.  Assessment Benign breast exam, status post treatment for ADH.  With tamoxifen.  Plan  Please call our office after you have your additional views on Monday.   Further follow-up will take place based on her upcoming studies.  At present, the patient is asymptomatic in regards to the umbilical hernia.  She will notify the office if this changes. We will contact you to schedule your  mammogram and follow up with Dr.Jillana Selph for June 2021.    HPI, Physical Exam, Assessment and Plan have been scribed under the direction and in the presence of Robert Bellow, MD. Jonnie Finner, CMA  I have completed the exam and reviewed the above documentation for accuracy and completeness.  I agree with the above.  Haematologist has been used and any errors in dictation or transcription are unintentional.  Hervey Ard, M.D., F.A.C.S.  [facility] Waddell Iten 11/08/2018, 1:37 PM

## 2018-11-09 ENCOUNTER — Ambulatory Visit
Admission: RE | Admit: 2018-11-09 | Discharge: 2018-11-09 | Disposition: A | Discharge status: Home / Self Care | Payer: 59 | Source: Ambulatory Visit | Attending: General Surgery | Admitting: General Surgery

## 2018-11-09 ENCOUNTER — Other Ambulatory Visit: Payer: Self-pay

## 2018-11-09 ENCOUNTER — Telehealth: Payer: Self-pay | Admitting: *Deleted

## 2018-11-09 DIAGNOSIS — R928 Other abnormal and inconclusive findings on diagnostic imaging of breast: Secondary | ICD-10-CM | POA: Diagnosis not present

## 2018-11-09 DIAGNOSIS — N6310 Unspecified lump in the right breast, unspecified quadrant: Secondary | ICD-10-CM | POA: Diagnosis present

## 2018-11-09 NOTE — Telephone Encounter (Signed)
The patient had her additional views and ultrasound of the right breast completed today.  It shows that the findings are related to a simple cyst.  The patient reports that her ventral hernia is becoming increasingly symptomatic, and it is difficult to tell whether this is a recurrence of the umbilical hernia or a new defect created at the time of her laparoscopic hysterectomy.  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  Pros and cons of mesh placement were reviewed.  She will likely go to work about 3-4 weeks.

## 2018-11-09 NOTE — Telephone Encounter (Signed)
Patient called and wanted to let you know that she had her diagnostic mammogram today at [facility]

## 2018-11-11 ENCOUNTER — Telehealth: Payer: Self-pay | Admitting: *Deleted

## 2018-11-11 NOTE — Telephone Encounter (Signed)
Patient contacted today about scheduling ventral hernia repair. She is aware she will need to stop smoking pre-op.   Patient's surgery to be scheduled for 11-23-18 at [facility] with Dr. Bary Castilla.  The patient is aware to have COVID-19 testing done on 11-19-18 at the [facility] drive thru ([address] [place]) between 8:00 am and 10:30 am. She is aware to isolate after, have no visitors, wash hands frequently, and avoid touching face.   The patient is aware she will be contacted by the [facility] to complete a phone interview sometime in the near future.  Patient aware to be NPO after midnight and have a driver.   She is aware to check in at the [facility] entrance where she will be screened for the coronavirus and then sent to [facility] Day Surgery.   Patient aware that she may have no visitors and driver will need to wait in the car due to COVID-19 restrictions.   The patient verbalizes understanding of the above.   The patient is aware to call the office should she have further questions.

## 2018-11-12 ENCOUNTER — Other Ambulatory Visit: Payer: Self-pay | Admitting: General Surgery

## 2018-11-12 DIAGNOSIS — K432 Incisional hernia without obstruction or gangrene: Secondary | ICD-10-CM

## 2018-11-17 ENCOUNTER — Other Ambulatory Visit: Payer: Self-pay

## 2018-11-17 ENCOUNTER — Encounter
Admission: RE | Admit: 2018-11-17 | Discharge: 2018-11-17 | Disposition: A | Discharge status: Home / Self Care | Payer: 59 | Source: Ambulatory Visit | Attending: General Surgery | Admitting: General Surgery

## 2018-11-17 DIAGNOSIS — Z1159 Encounter for screening for other viral diseases: Secondary | ICD-10-CM | POA: Insufficient documentation

## 2018-11-17 DIAGNOSIS — Z01818 Encounter for other preprocedural examination: Secondary | ICD-10-CM | POA: Insufficient documentation

## 2018-11-19 ENCOUNTER — Other Ambulatory Visit: Payer: Self-pay

## 2018-11-19 ENCOUNTER — Other Ambulatory Visit
Admission: RE | Admit: 2018-11-19 | Discharge: 2018-11-19 | Disposition: A | Discharge status: Home / Self Care | Payer: 59 | Source: Ambulatory Visit | Attending: General Surgery | Admitting: General Surgery

## 2018-11-19 DIAGNOSIS — Z01818 Encounter for other preprocedural examination: Secondary | ICD-10-CM | POA: Diagnosis not present

## 2018-11-20 LAB — NOVEL CORONAVIRUS, NAA (HOSP ORDER, SEND-OUT TO REF LAB; TAT 18-24 HRS): SARS-CoV-2, NAA: NOT DETECTED

## 2018-11-22 MED ORDER — CLINDAMYCIN PHOSPHATE 900 MG/50ML IV SOLN: 900.0000 mg | INTRAVENOUS | Status: DC

## 2018-11-23 ENCOUNTER — Ambulatory Visit: Payer: 59 | Admitting: Certified Registered"

## 2018-11-23 ENCOUNTER — Other Ambulatory Visit: Payer: Self-pay

## 2018-11-23 ENCOUNTER — Ambulatory Visit
Admission: RE | Admit: 2018-11-23 | Discharge: 2018-11-23 | Disposition: A | Discharge status: Home / Self Care | Payer: 59 | Attending: General Surgery | Admitting: General Surgery

## 2018-11-23 ENCOUNTER — Encounter
Admission: RE | Disposition: A | Discharge status: Home / Self Care | Payer: Self-pay | Source: Home / Self Care | Attending: General Surgery

## 2018-11-23 ENCOUNTER — Encounter: Payer: Self-pay | Admitting: Emergency Medicine

## 2018-11-23 DIAGNOSIS — K432 Incisional hernia without obstruction or gangrene: Secondary | ICD-10-CM

## 2018-11-23 DIAGNOSIS — Z880 Allergy status to penicillin: Secondary | ICD-10-CM | POA: Insufficient documentation

## 2018-11-23 DIAGNOSIS — F17210 Nicotine dependence, cigarettes, uncomplicated: Secondary | ICD-10-CM | POA: Insufficient documentation

## 2018-11-23 HISTORY — PX: VENTRAL HERNIA REPAIR: SHX424

## 2018-11-23 SURGERY — REPAIR, HERNIA, VENTRAL
Anesthesia: General

## 2018-11-23 MED ORDER — PROMETHAZINE HCL 25 MG/ML IJ SOLN: 6.2500 mg | INTRAMUSCULAR | Status: DC | PRN

## 2018-11-23 MED ORDER — PROPOFOL 10 MG/ML IV BOLUS
INTRAVENOUS | Status: DC | PRN
  Administered 2018-11-23: 150 mg via INTRAVENOUS

## 2018-11-23 MED ORDER — ACETAMINOPHEN 10 MG/ML IV SOLN
INTRAVENOUS | Status: DC | PRN
  Administered 2018-11-23: 1000 mg via INTRAVENOUS

## 2018-11-23 MED ORDER — FENTANYL CITRATE (PF) 100 MCG/2ML IJ SOLN
INTRAMUSCULAR | Status: AC
  Administered 2018-11-23: 25 ug via INTRAVENOUS
  Filled 2018-11-23: qty 2

## 2018-11-23 MED ORDER — GABAPENTIN 300 MG PO CAPS
300.0000 mg | ORAL_CAPSULE | ORAL | Status: AC
  Administered 2018-11-23: 300 mg via ORAL

## 2018-11-23 MED ORDER — SUGAMMADEX SODIUM 200 MG/2ML IV SOLN
INTRAVENOUS | Status: DC | PRN
  Administered 2018-11-23: 140 mg via INTRAVENOUS

## 2018-11-23 MED ORDER — OXYCODONE HCL 5 MG PO TABS: 5.0000 mg | ORAL_TABLET | Freq: Once | ORAL | Status: DC | PRN

## 2018-11-23 MED ORDER — CLINDAMYCIN PHOSPHATE 900 MG/50ML IV SOLN
INTRAVENOUS | Status: AC
  Filled 2018-11-23: qty 50

## 2018-11-23 MED ORDER — HYDROCODONE-ACETAMINOPHEN 5-325 MG PO TABS
1.0000 | ORAL_TABLET | Freq: Once | ORAL | Status: AC
  Administered 2018-11-23: 1 via ORAL

## 2018-11-23 MED ORDER — FAMOTIDINE 20 MG PO TABS
20.0000 mg | ORAL_TABLET | Freq: Once | ORAL | Status: AC
  Administered 2018-11-23: 20 mg via ORAL

## 2018-11-23 MED ORDER — PHENYLEPHRINE HCL (PRESSORS) 10 MG/ML IV SOLN
INTRAVENOUS | Status: DC | PRN
  Administered 2018-11-23 (×2): 100 ug via INTRAVENOUS

## 2018-11-23 MED ORDER — MIDAZOLAM HCL 2 MG/2ML IJ SOLN
INTRAMUSCULAR | Status: DC | PRN
  Administered 2018-11-23: 2 mg via INTRAVENOUS

## 2018-11-23 MED ORDER — HYDROCODONE-ACETAMINOPHEN 5-325 MG PO TABS: 1.0000 | ORAL_TABLET | ORAL | 0 refills | Status: AC | PRN

## 2018-11-23 MED ORDER — FENTANYL CITRATE (PF) 250 MCG/5ML IJ SOLN
INTRAMUSCULAR | Status: AC
  Filled 2018-11-23: qty 5

## 2018-11-23 MED ORDER — GABAPENTIN 300 MG PO CAPS
ORAL_CAPSULE | ORAL | Status: AC
  Filled 2018-11-23: qty 1

## 2018-11-23 MED ORDER — FENTANYL CITRATE (PF) 100 MCG/2ML IJ SOLN
INTRAMUSCULAR | Status: DC | PRN
  Administered 2018-11-23: 50 ug via INTRAVENOUS
  Administered 2018-11-23 (×2): 75 ug via INTRAVENOUS
  Administered 2018-11-23: 50 ug via INTRAVENOUS

## 2018-11-23 MED ORDER — BUPIVACAINE HCL (PF) 0.5 % IJ SOLN
INTRAMUSCULAR | Status: DC | PRN
  Administered 2018-11-23: 30 mL

## 2018-11-23 MED ORDER — MIDAZOLAM HCL 2 MG/2ML IJ SOLN
INTRAMUSCULAR | Status: AC
  Filled 2018-11-23: qty 2

## 2018-11-23 MED ORDER — OXYCODONE HCL 5 MG/5ML PO SOLN: 5.0000 mg | Freq: Once | ORAL | Status: DC | PRN

## 2018-11-23 MED ORDER — SCOPOLAMINE 1 MG/3DAYS TD PT72
1.0000 | MEDICATED_PATCH | TRANSDERMAL | Status: DC
  Administered 2018-11-23: 10:00:00 1.5 mg via TRANSDERMAL

## 2018-11-23 MED ORDER — ROCURONIUM BROMIDE 100 MG/10ML IV SOLN
INTRAVENOUS | Status: DC | PRN
  Administered 2018-11-23: 50 mg via INTRAVENOUS

## 2018-11-23 MED ORDER — FENTANYL CITRATE (PF) 100 MCG/2ML IJ SOLN
25.0000 ug | INTRAMUSCULAR | Status: DC | PRN
  Administered 2018-11-23 (×3): 25 ug via INTRAVENOUS

## 2018-11-23 MED ORDER — ACETAMINOPHEN 10 MG/ML IV SOLN
INTRAVENOUS | Status: AC
  Filled 2018-11-23: qty 100

## 2018-11-23 MED ORDER — PROPOFOL 10 MG/ML IV BOLUS
INTRAVENOUS | Status: AC
  Filled 2018-11-23: qty 20

## 2018-11-23 MED ORDER — BUPIVACAINE-EPINEPHRINE (PF) 0.5% -1:200000 IJ SOLN
INTRAMUSCULAR | Status: AC
  Filled 2018-11-23: qty 30

## 2018-11-23 MED ORDER — HYDROCODONE-ACETAMINOPHEN 5-325 MG PO TABS
ORAL_TABLET | ORAL | Status: AC
  Filled 2018-11-23: qty 1

## 2018-11-23 MED ORDER — DEXAMETHASONE SODIUM PHOSPHATE 10 MG/ML IJ SOLN
INTRAMUSCULAR | Status: DC | PRN
  Administered 2018-11-23: 5 mg via INTRAVENOUS

## 2018-11-23 MED ORDER — MEPERIDINE HCL 50 MG/ML IJ SOLN: 6.2500 mg | INTRAMUSCULAR | Status: DC | PRN

## 2018-11-23 MED ORDER — LIDOCAINE HCL (CARDIAC) PF 100 MG/5ML IV SOSY
PREFILLED_SYRINGE | INTRAVENOUS | Status: DC | PRN
  Administered 2018-11-23: 70 mg via INTRAVENOUS

## 2018-11-23 MED ORDER — LACTATED RINGERS IV SOLN
INTRAVENOUS | Status: DC
  Administered 2018-11-23: 10:00:00 via INTRAVENOUS

## 2018-11-23 MED ORDER — FAMOTIDINE 20 MG PO TABS
ORAL_TABLET | ORAL | Status: AC
  Filled 2018-11-23: qty 1

## 2018-11-23 MED ORDER — SCOPOLAMINE 1 MG/3DAYS TD PT72
MEDICATED_PATCH | TRANSDERMAL | Status: AC
  Filled 2018-11-23: qty 1

## 2018-11-23 MED ORDER — BUPIVACAINE HCL (PF) 0.5 % IJ SOLN
INTRAMUSCULAR | Status: AC
  Filled 2018-11-23: qty 30

## 2018-11-23 MED ORDER — ONDANSETRON HCL 4 MG/2ML IJ SOLN
INTRAMUSCULAR | Status: DC | PRN
  Administered 2018-11-23: 4 mg via INTRAVENOUS

## 2018-11-23 SURGICAL SUPPLY — 38 items
APL PRP STRL LF DISP 70% ISPRP (MISCELLANEOUS) ×1
BLADE SURG 15 STRL SS SAFETY (BLADE) ×2 IMPLANT
CANISTER SUCT 1200ML W/VALVE (MISCELLANEOUS) ×2 IMPLANT
CHLORAPREP W/TINT 26 (MISCELLANEOUS) ×2 IMPLANT
COVER WAND RF STERILE (DRAPES) ×2 IMPLANT
DRAIN CHANNEL JP 15F RND 16 (MISCELLANEOUS) ×2 IMPLANT
DRAPE CHEST BREAST 77X106 FENE (MISCELLANEOUS) ×1 IMPLANT
DRAPE LAPAROTOMY 100X77 ABD (DRAPES) ×2 IMPLANT
DRSG TEGADERM 4X4.75 (GAUZE/BANDAGES/DRESSINGS) ×3 IMPLANT
DRSG TELFA 3X8 NADH (GAUZE/BANDAGES/DRESSINGS) ×2 IMPLANT
ELECT REM PT RETURN 9FT ADLT (ELECTROSURGICAL) ×2
ELECTRODE REM PT RTRN 9FT ADLT (ELECTROSURGICAL) ×1 IMPLANT
GAUZE SPONGE 4X4 12PLY STRL (GAUZE/BANDAGES/DRESSINGS) ×1 IMPLANT
GLOVE BIO SURGEON STRL SZ7.5 (GLOVE) ×4 IMPLANT
GLOVE INDICATOR 8.0 STRL GRN (GLOVE) ×4 IMPLANT
GOWN STRL REUS W/ TWL LRG LVL3 (GOWN DISPOSABLE) ×2 IMPLANT
GOWN STRL REUS W/TWL LRG LVL3 (GOWN DISPOSABLE) ×6
KIT TURNOVER KIT A (KITS) ×2 IMPLANT
LABEL OR SOLS (LABEL) ×2 IMPLANT
MESH VENTRALEX ST 2.5 CRC MED (Mesh General) ×1 IMPLANT
NDL HYPO 25X1 1.5 SAFETY (NEEDLE) ×1 IMPLANT
NEEDLE HYPO 22GX1.5 SAFETY (NEEDLE) ×2 IMPLANT
NEEDLE HYPO 25X1 1.5 SAFETY (NEEDLE) ×2 IMPLANT
NS IRRIG 500ML POUR BTL (IV SOLUTION) ×2 IMPLANT
PACK BASIN MINOR ARMC (MISCELLANEOUS) ×2 IMPLANT
PAD DRESSING TELFA 3X8 NADH (GAUZE/BANDAGES/DRESSINGS) ×1 IMPLANT
SPONGE LAP 18X18 RF (DISPOSABLE) ×1 IMPLANT
STAPLER SKIN PROX 35W (STAPLE) ×1 IMPLANT
STRIP CLOSURE SKIN 1/2X4 (GAUZE/BANDAGES/DRESSINGS) ×2 IMPLANT
SUT SURGILON 0 BLK (SUTURE) ×4 IMPLANT
SUT VIC AB 2-0 BRD 54 (SUTURE) ×2 IMPLANT
SUT VIC AB 3-0 SH 27 (SUTURE) ×2
SUT VIC AB 3-0 SH 27X BRD (SUTURE) ×1 IMPLANT
SUT VIC AB 4-0 FS2 27 (SUTURE) ×2 IMPLANT
SUT VICRYL+ 3-0 144IN (SUTURE) ×2 IMPLANT
SYR 10ML LL (SYRINGE) ×2 IMPLANT
SYR 3ML LL SCALE MARK (SYRINGE) ×2 IMPLANT
TRAY FOLEY MTR SLVR 16FR STAT (SET/KITS/TRAYS/PACK) ×1 IMPLANT

## 2018-11-23 NOTE — Anesthesia Preprocedure Evaluation (Signed)
Anesthesia Evaluation  Patient identified by MRN, date of birth, ID band Patient awake    Reviewed: Allergy & Precautions, NPO status , Patient's Chart, lab work & pertinent test results  History of Anesthesia Complications (+) PONV and history of anesthetic complications  Airway Mallampati: II  TM Distance: >3 FB Neck ROM: Full    Dental no notable dental hx.    Pulmonary neg sleep apnea, neg COPD, Current Smoker,    breath sounds clear to auscultation- rhonchi (-) wheezing      Cardiovascular Exercise Tolerance: Good (-) hypertension(-) CAD, (-) Past MI, (-) Cardiac Stents and (-) CABG  Rhythm:Regular Rate:Normal - Systolic murmurs and - Diastolic murmurs    Neuro/Psych neg Seizures negative neurological ROS  negative psych ROS   GI/Hepatic negative GI ROS, Neg liver ROS,   Endo/Other  negative endocrine ROSneg diabetes  Renal/GU negative Renal ROS     Musculoskeletal negative musculoskeletal ROS (+)   Abdominal (+) - obese,   Peds  Hematology  (+) anemia ,   Anesthesia Other Findings Past Medical History: No date: Anemia     Comment:  WITH PREGNANCY ONLY 3382: Complication of anesthesia     Comment:  HARD TIME WAKING UP FOR 1ST BREAST SURGERY No date: Family history of adverse reaction to anesthesia     Comment:  MOM-N/V No date: Heart murmur     Comment:  AS A CHILD. no one follows it No date: Hematuria, microscopic No date: Ovarian cyst No date: Pyelonephritis   Reproductive/Obstetrics                             Anesthesia Physical Anesthesia Plan  ASA: II  Anesthesia Plan: General   Post-op Pain Management:    Induction: Intravenous  PONV Risk Score and Plan: 2 and Ondansetron, Dexamethasone and Midazolam  Airway Management Planned: Oral ETT  Additional Equipment:   Intra-op Plan:   Post-operative Plan: Extubation in OR  Informed Consent: I have reviewed the  patients History and Physical, chart, labs and discussed the procedure including the risks, benefits and alternatives for the proposed anesthesia with the patient or authorized representative who has indicated his/her understanding and acceptance.     Dental advisory given  Plan Discussed with: CRNA and Anesthesiologist  Anesthesia Plan Comments:         Anesthesia Quick Evaluation

## 2018-11-23 NOTE — Anesthesia Procedure Notes (Signed)
Procedure Name: Intubation Date/Time: 11/23/2018 1:31 PM Performed by: Chanetta Marshall, CRNA Pre-anesthesia Checklist: Patient identified, Emergency Drugs available, Suction available and Patient being monitored Patient Re-evaluated:Patient Re-evaluated prior to induction Oxygen Delivery Method: Circle system utilized Preoxygenation: Pre-oxygenation with 100% oxygen Induction Type: IV induction Ventilation: Mask ventilation without difficulty Laryngoscope Size: Mac and 3 Grade View: Grade I Tube type: Oral Number of attempts: 1 Airway Equipment and Method: Stylet and Oral airway Placement Confirmation: ETT inserted through vocal cords under direct vision,  positive ETCO2,  breath sounds checked- equal and bilateral and CO2 detector Secured at: 21 cm Tube secured with: Tape Dental Injury: Teeth and Oropharynx as per pre-operative assessment

## 2018-11-23 NOTE — Op Note (Signed)
Preoperative diagnosis: Recurrent ventral hernia.  Postoperative diagnosis: Same.  Operative procedure: Repair of recurrent ventral hernia with 6.4 cm ventralex preperitoneal mesh.  Operating surgeon Hervey Ard, MD.  Anesthesia: General endotracheal, Marcaine 0.5%, plain, 30 cc.  Estimated blood loss: Less than 5 cc.  Clinical note: Woman received a clindamycin for preoperative antibiotics due to a profound penicillin allergy.  SCD for DVT prevention.  The patient had a primary repair of an a small umbilical hernia 2 years ago.  Since then she is undergone a laparoscopic hysterectomy.  She is developed recurrent hernia and is brought to the operating room for elective repair.  Operative note: The patient underwent general anesthesia without difficulty.  The abdomen was cleansed with ChloraPrep and draped.  The previous infraumbilical incision was opened.  Hemostasis was electrocautery.  The umbilical skin was elevated off the hernia.  The sac was freed circumferentially for a distance of 6 cm in diameter.  A very small rent in one area did not require repair.  A 6.4 cm ventralex polypropylene mesh was then placed in the preperitoneal location.  It was transfixed in 4 points with trans-fascial sutures of 0 Surgilon.  The fascia was then easily approximated in the midline with interrupted 0 Nurolon sutures.  The local skin was transfixed to the deep fascia with a single 2-0 Vicryl suture.  The adipose tissue was approximated with a running 2-0 Vicryl suture.  The skin was closed with a running 4-0 Vicryl subcuticular suture.  Benzoin and Steri-Strips followed by Telfa and Tegaderm dressing applied.  The patient tolerated the procedure well and was taken recovery in stable condition.

## 2018-11-23 NOTE — H&P (Signed)
No interval change in clinical history or exam. For ventral hernia repair.

## 2018-11-23 NOTE — Discharge Instructions (Signed)

## 2018-11-23 NOTE — OR Nursing (Signed)
Per Dr. Bary Castilla verbal [phone], okay to discharge pt to home without his visit to postop.

## 2018-11-23 NOTE — Anesthesia Post-op Follow-up Note (Signed)
Anesthesia QCDR form completed.        

## 2018-11-23 NOTE — Transfer of Care (Signed)
Immediate Anesthesia Transfer of Care Note  Patient: Madison Adams  Procedure(s) Performed: HERNIA REPAIR VENTRAL ADULT, RECURENT WITH MESH (N/A )  Patient Location: PACU  Anesthesia Type:General  Level of Consciousness: awake, alert  and oriented  Airway & Oxygen Therapy: Patient Spontanous Breathing and Patient connected to nasal cannula oxygen  Post-op Assessment: Report given to RN and Post -op Vital signs reviewed and stable  Post vital signs: Reviewed and stable  Last Vitals:  Vitals Value Taken Time  BP    Temp    Pulse    Resp    SpO2      Last Pain:  Vitals:   11/23/18 1005  TempSrc: Tympanic  PainSc: 4          Complications: No apparent anesthesia complications

## 2018-11-24 ENCOUNTER — Encounter: Payer: Self-pay | Admitting: General Surgery

## 2018-11-25 NOTE — Anesthesia Postprocedure Evaluation (Signed)
Anesthesia Post Note  Patient: Madison Adams  Procedure(s) Performed: HERNIA REPAIR VENTRAL ADULT, RECURENT WITH MESH (N/A )  Patient location during evaluation: PACU Anesthesia Type: General Level of consciousness: awake and alert Pain management: pain level controlled Vital Signs Assessment: post-procedure vital signs reviewed and stable Respiratory status: spontaneous breathing, nonlabored ventilation, respiratory function stable and patient connected to nasal cannula oxygen Cardiovascular status: blood pressure returned to baseline and stable Postop Assessment: no apparent nausea or vomiting Anesthetic complications: no     Last Vitals:  Vitals:   11/23/18 1458 11/23/18 1530  BP: 139/85 123/76  Pulse: 68 (!) 59  Resp: 18 18  Temp: (!) 36.4 C   SpO2: 98% 98%    Last Pain:  Vitals:   11/24/18 1020  TempSrc:   PainSc: 0-No pain                 Molli Barrows

## 2018-12-01 ENCOUNTER — Ambulatory Visit (INDEPENDENT_AMBULATORY_CARE_PROVIDER_SITE_OTHER): Payer: 59 | Admitting: General Surgery

## 2018-12-01 ENCOUNTER — Other Ambulatory Visit: Payer: Self-pay

## 2018-12-01 VITALS — BP 130/83 | HR 103 | Temp 97.9°F | Ht 65.0 in | Wt 146.0 lb

## 2018-12-01 DIAGNOSIS — K432 Incisional hernia without obstruction or gangrene: Secondary | ICD-10-CM

## 2018-12-01 NOTE — Patient Instructions (Signed)
Return in one month. The patient is aware to call back for any questions or concerns.  

## 2018-12-01 NOTE — Progress Notes (Signed)
Patient ID: Madison Adams, female   DOB: [birth year], 48 y.o.   MRN: [id number]  Chief Complaint  Patient presents with  . Routine Post Op    HPI Madison Adams is a 48 y.o. female here today for her post op ventral hernia repair done on 11/23/2018. Patient states she is doing well.  HPI  Past Medical History:  Diagnosis Date  . Anemia    WITH PREGNANCY ONLY  . Complication of anesthesia 2015   HARD TIME WAKING UP FOR 1ST BREAST SURGERY  . Family history of adverse reaction to anesthesia    MOM-N/V  . Heart murmur    AS A CHILD. no one follows it  . Hematuria, microscopic   . Ovarian cyst   . Pyelonephritis     Past Surgical History:  Procedure Laterality Date  . BREAST BIOPSY Right 10-28-13   FOCUS OF ATYPICAL DUCTAL HYPERPLASIA, 1 MM.  Marland Kitchen BREAST SURGERY Right 01-07-14   1 mm foci of atypical ductal hyperplasia, Gail model risk 3.8%, 5 years; 34.5% lifetime.  . CYSTOSCOPY N/A 12/11/2017   Procedure: CYSTOSCOPY;  Surgeon: Malachy Mood, MD;  Location: [facility] ORS;  Service: Gynecology;  Laterality: N/A;  . HYSTEROSCOPY W/D&C N/A 05/02/2017   Procedure: DILATATION AND CURETTAGE /HYSTEROSCOPY;  Surgeon: Malachy Mood, MD;  Location: [facility] OR[facility];  Service: Gynecology;  Laterality: N/A;  . LAPAROSCOPIC HYSTERECTOMY Bilateral 12/11/2017   Procedure: HYSTERECTOMY TOTAL LAPAROSCOPIC, BILATERAL SALPINGECTOMY;  Surgeon: Malachy Mood, MD;  Location: [facility] ORS;  Service: Gynecology;  Laterality: Bilateral;  . TUBAL LIGATION  01/14/96  . UMBILICAL HERNIA REPAIR N/A 10/30/2016   Procedure: HERNIA REPAIR UMBILICAL ADULT;  Surgeon: Robert Bellow, MD;  Location: [facility] OR[facility];  Service: General;  Laterality: N/A;  . VENTRAL HERNIA REPAIR N/A 11/23/2018   6.4 cm preperitoneal Ventralex mesh;  Surgeon: Robert Bellow, MD;  Location: [facility] ORS;  Service: General;  Laterality: N/A;  . WISDOM TOOTH EXTRACTION      Family History  Problem Relation Age of Onset  . Breast cancer Mother 85  .  Healthy Father   . Heart failure Paternal Grandfather 74    Social History Social History   Tobacco Use  . Smoking status: Current Every Day Smoker    Packs/day: 0.50    Years: 11.00    Pack years: 5.50    Types: Cigarettes    Start date: 01/16/1997  . Smokeless tobacco: Never Used  Substance Use Topics  . Alcohol use: No    Alcohol/week: 0.0 standard drinks  . Drug use: No    Allergies  Allergen Reactions  . Penicillins Anaphylaxis, Hives, Shortness Of Breath, Swelling and Other (See Comments)    Has patient had a PCN reaction causing immediate rash, facial/tongue/throat swelling, SOB or lightheadedness with hypotension: Yes Has patient had a PCN reaction causing severe rash involving mucus membranes or skin necrosis: Yes Has patient had a PCN reaction that required hospitalization:Treated in ER Has patient had a PCN reaction occurring within the last 10 years: No If all of the above answers are "NO", then may proceed with Cephalosporin use. Childhood allergy    Current Outpatient Medications  Medication Sig Dispense Refill  . HYDROcodone-acetaminophen (NORCO/VICODIN) 5-325 MG tablet Take 1 tablet by mouth every 4 (four) hours as needed for moderate pain. 15 tablet 0  . Multiple Vitamin (MULTIVITAMIN WITH MINERALS) TABS tablet Take 1 tablet by mouth daily. Women's Multivitamin    . raloxifene (EVISTA) 60 MG tablet Take 60 mg by mouth  every evening.     No current facility-administered medications for this visit.     Review of Systems Review of Systems  Blood pressure 130/83, pulse (!) 103, temperature 97.9 F (36.6 C), temperature source Skin, height 5\' 5"  (1.651 m), weight 146 lb (66.2 kg), last menstrual period 12/07/2017, SpO2 99 %.  Physical Exam Physical Exam Constitutional:      Appearance: Normal appearance.  Abdominal:    Skin:    General: Skin is warm and dry.  Neurological:     General: No focal deficit present.     Mental Status: She is alert and  oriented to person, place, and time.        Assessment Doing well post hernia repair.   Plan  Patient to return in one month.  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   The patient is aware to call back for any questions or concerns.   HPI, Physical Exam, Assessment and Plan have been scribed under the direction and in the presence of Hervey Ard, MD.  Gaspar Cola, CMA  I have completed the exam and reviewed the above documentation for accuracy and completeness.  I agree with the above.  Haematologist has been used and any errors in dictation or transcription are unintentional.  Hervey Ard, M.D., F.A.C.S. [facility] Shaquanta Harkless 12/02/2018, 7:34 AM

## 2018-12-02 ENCOUNTER — Encounter: Payer: Self-pay | Admitting: General Surgery

## 2018-12-08 ENCOUNTER — Telehealth: Payer: Self-pay | Admitting: *Deleted

## 2018-12-08 NOTE — Telephone Encounter (Signed)
Spoke with patient and she will resend FMLA paperwork to reflect return to work date after her appointment on 01/05/2019. Nurse at work said she could return after her appointment with Dr.Byrnett per patient.

## 2018-12-08 NOTE — Telephone Encounter (Signed)
Patient called to state that her short term disability has her returning on 01-03-19 but her follow up with Dr. Bary Castilla is not until 01-05-19.  The patient would like a call back at [phone].  Note routed to [facility] Clinical Pool.

## 2019-01-05 ENCOUNTER — Other Ambulatory Visit: Payer: Self-pay

## 2019-01-05 ENCOUNTER — Ambulatory Visit (INDEPENDENT_AMBULATORY_CARE_PROVIDER_SITE_OTHER): Payer: 59 | Admitting: Physician Assistant

## 2019-01-05 ENCOUNTER — Encounter: Payer: Self-pay | Admitting: Physician Assistant

## 2019-01-05 ENCOUNTER — Encounter: Payer: 59 | Admitting: General Surgery

## 2019-01-05 DIAGNOSIS — Z09 Encounter for follow-up examination after completed treatment for conditions other than malignant neoplasm: Secondary | ICD-10-CM

## 2019-01-05 DIAGNOSIS — K432 Incisional hernia without obstruction or gangrene: Secondary | ICD-10-CM

## 2019-01-05 NOTE — Progress Notes (Signed)
[facility] POST-OP OFFICE VISIT  01/05/2019  HPI: Madison Adams is a 48 y.o. female 6 weeks s/p ventral hernia repair with mesh. She was seen in follow up on 07/07 and was doing well. She returns to clinic for 6 week reassessment and clearance to return to work.   Today, she reports she is doing well. She has intermittent pain at her incision after some activity. She denied any heavy lifting, but she has been walking and doing light activity. No fever, chills, nausea, or emesis. She did report intermittent constipation, which has been chronic for her. She has tried colace with some improvement. Otherwise no acute issues.   Vital signs: LMP 12/07/2017 (Exact Date)    Physical Exam: Constitutional: Well appearing female, NAD Abdomen: Soft, mild incisional tenderness, non-distended.  Skin: Her supraumbilical incision is well healed, at the right later edge she did have suture protruding from it which was removed. No erythema or drainage.   Assessment/Plan: This is a 49 y.o. female overall doing well 6 weeks s/p ventral hernia repair with mesh    - Cleared for return to work without restriction on 08/17; discussed that at 6 weeks post-op her abdominal muscles are at ~80%; she understands to ease back into more strenuous activity   - Pain control prn; she is not taking narcotic pain medications and encourage to limit these if constipation is an issue  - Miralax up to BID PRN for additional constipation relief  - Okay to submerge wound  - RTC PRN or sooner if needed; she wishes to continue to follow with [place] for her annual mammogram  -- Edison Simon, PA-C [facility] 01/05/2019, 11:53 AM [phone] M-F: 7am - 4pm

## 2019-01-05 NOTE — Patient Instructions (Signed)
Miralax can be purchased at [place] or any drug store.     GENERAL POST-OPERATIVE PATIENT INSTRUCTIONS   WOUND CARE INSTRUCTIONS:  Keep a dry clean dressing on the wound if there is drainage. The initial bandage may be removed after 24 hours.  Once the wound has quit draining you may leave it open to air.  If clothing rubs against the wound or causes irritation and the wound is not draining you may cover it with a dry dressing during the daytime.  Try to keep the wound dry and avoid ointments on the wound unless directed to do so.  If the wound becomes bright red and painful or starts to drain infected material that is not clear, please contact your physician immediately.  If the wound is mildly pink and has a thick firm ridge underneath it, this is normal, and is referred to as a healing ridge.  This will resolve over the next 4-6 weeks.  BATHING: You may shower if you have been informed of this by your surgeon. However, Please do not submerge in a tub, hot tub, or pool until incisions are completely sealed or have been told by your surgeon that you may do so.  DIET:  You may eat any foods that you can tolerate.  It is a good idea to eat a high fiber diet and take in plenty of fluids to prevent constipation.  If you do become constipated you may want to take a mild laxative or take ducolax tablets on a daily basis until your bowel habits are regular.  Constipation can be very uncomfortable, along with straining, after recent surgery.  ACTIVITY:  You are encouraged to cough and deep breath or use your incentive spirometer if you were given one, every 15-30 minutes when awake.  This will help prevent respiratory complications and low grade fevers post-operatively if you had a general anesthetic.  You may want to hug a pillow when coughing and sneezing to add additional support to the surgical area, if you had abdominal or chest surgery, which will decrease pain during these times.  You are encouraged to  walk and engage in light activity for the next two weeks.  You should not lift more than 20 pounds, until 01/11/2019  as it could put you at increased risk for complications.  Twenty pounds is roughly equivalent to a plastic bag of groceries. At that time- Listen to your body when lifting, if you have pain when lifting, stop and then try again in a few days. Soreness after doing exercises or activities of daily living is normal as you get back in to your normal routine.  MEDICATIONS:  Try to take narcotic medications and anti-inflammatory medications, such as tylenol, ibuprofen, naprosyn, etc., with food.  This will minimize stomach upset from the medication.  Should you develop nausea and vomiting from the pain medication, or develop a rash, please discontinue the medication and contact your physician.  You should not drive, make important decisions, or operate machinery when taking narcotic pain medication.  SUNBLOCK Use sun block to incision area over the next year if this area will be exposed to sun. This helps decrease scarring and will allow you avoid a permanent darkened area over your incision.  QUESTIONS:  Please feel free to call our office if you have any questions, and we will be glad to assist you. [phone]

## 2019-02-24 IMAGING — MG MM DIGITAL DIAGNOSTIC BILAT W/ TOMO W/ CAD
9 of 13 series · 9 of 29 positions shown · non-contrast
Comparison: Previous exam(s).

CLINICAL DATA: Post excisional biopsy of the right breast for
atypical ductal hyperplasia. Patient is asymptomatic.

EXAM:
2D DIGITAL DIAGNOSTIC BILATERAL MAMMOGRAM WITH CAD AND ADJUNCT TOMO

[R MLO (1 of 2)]
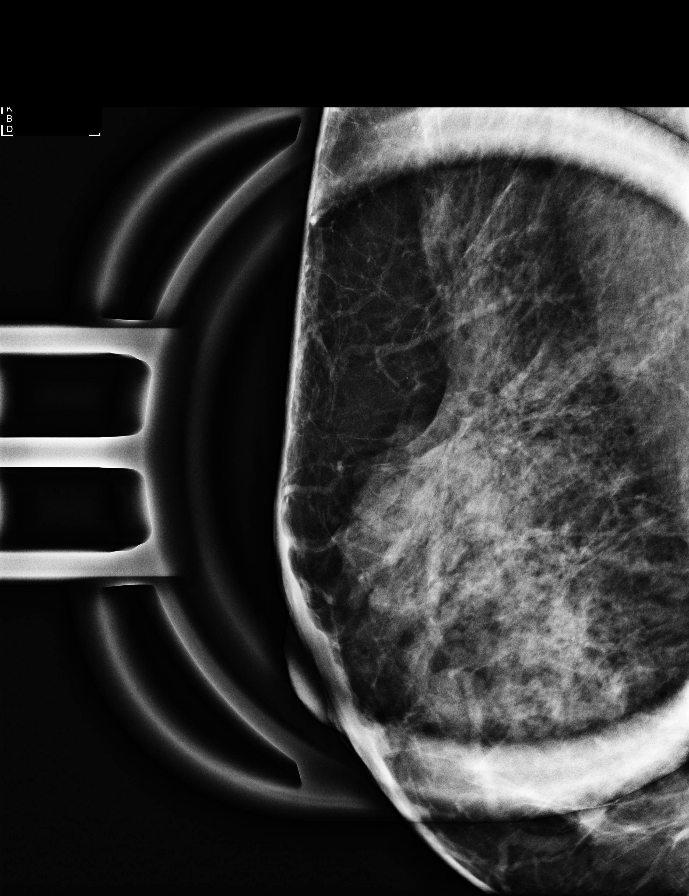

[R MLO (2 of 2)]
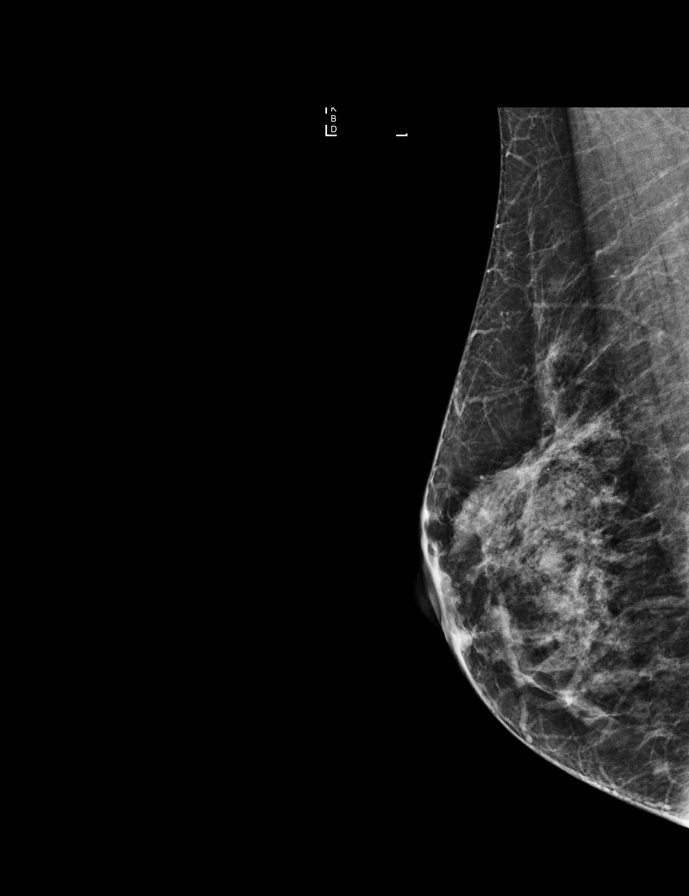

[R MLO synth-2D]
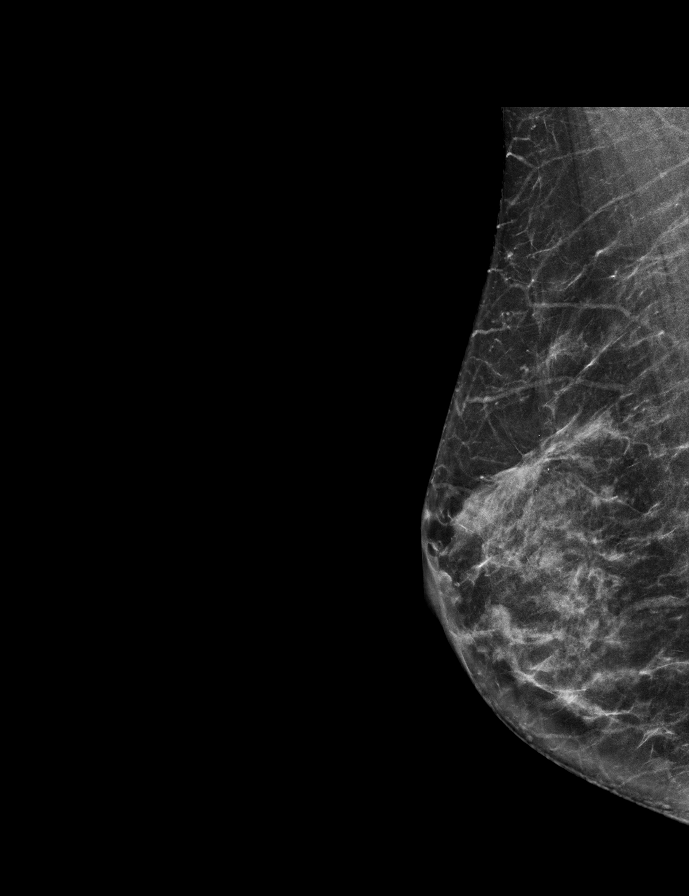

[L MLO synth-2D]
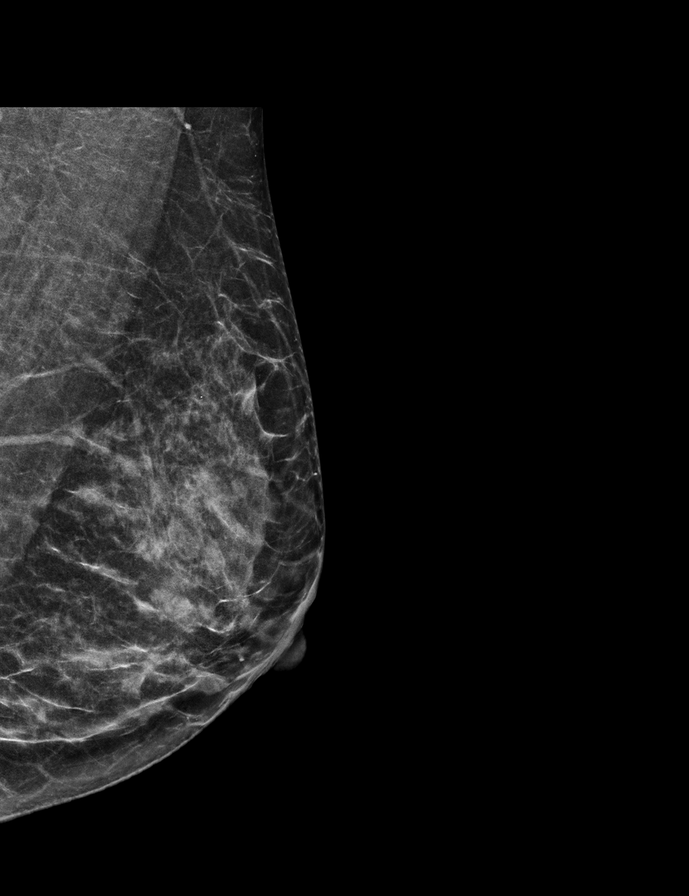

[L CC]
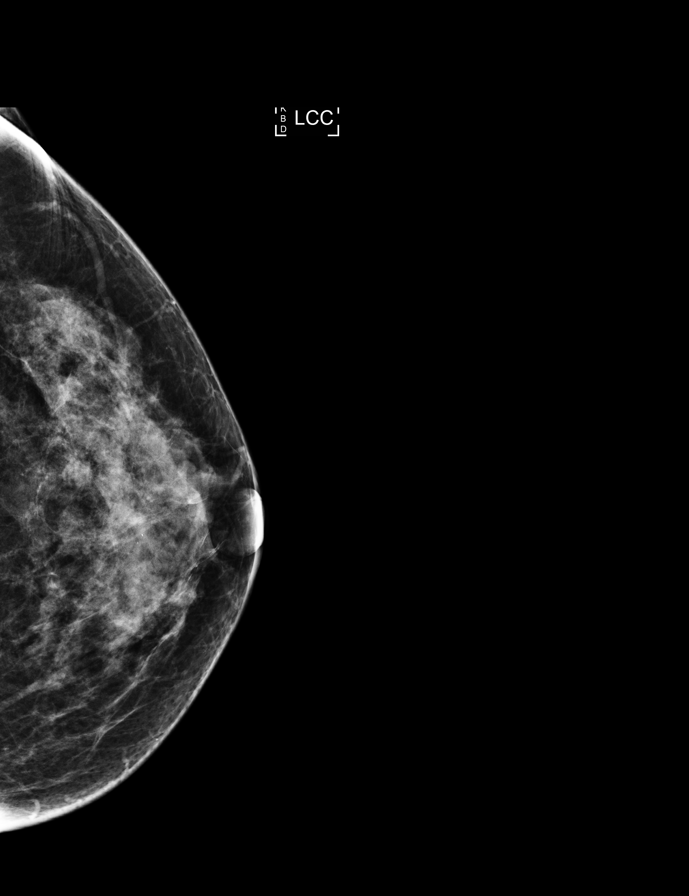

[L MLO]
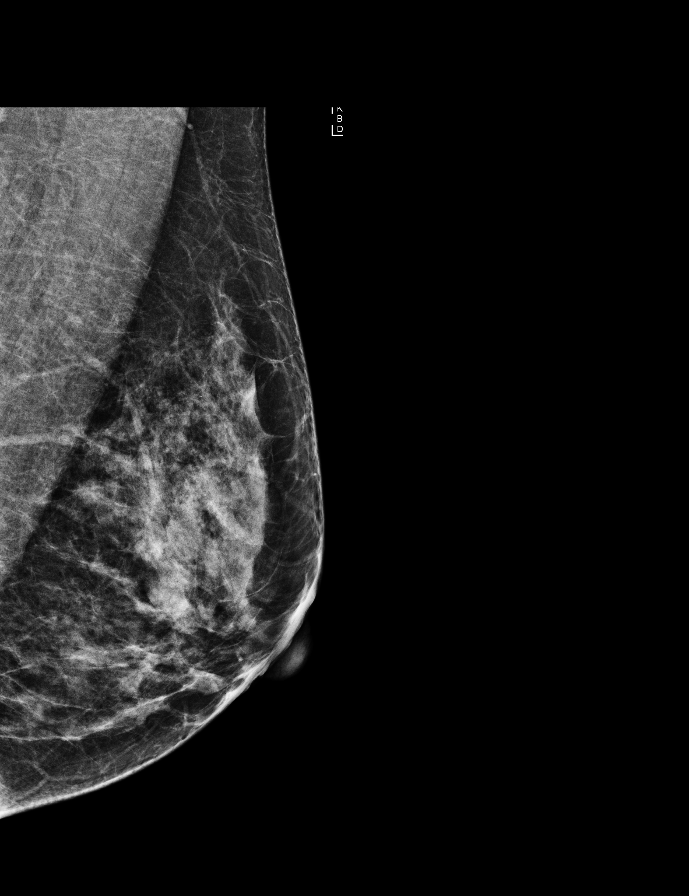

[L CC synth-2D]
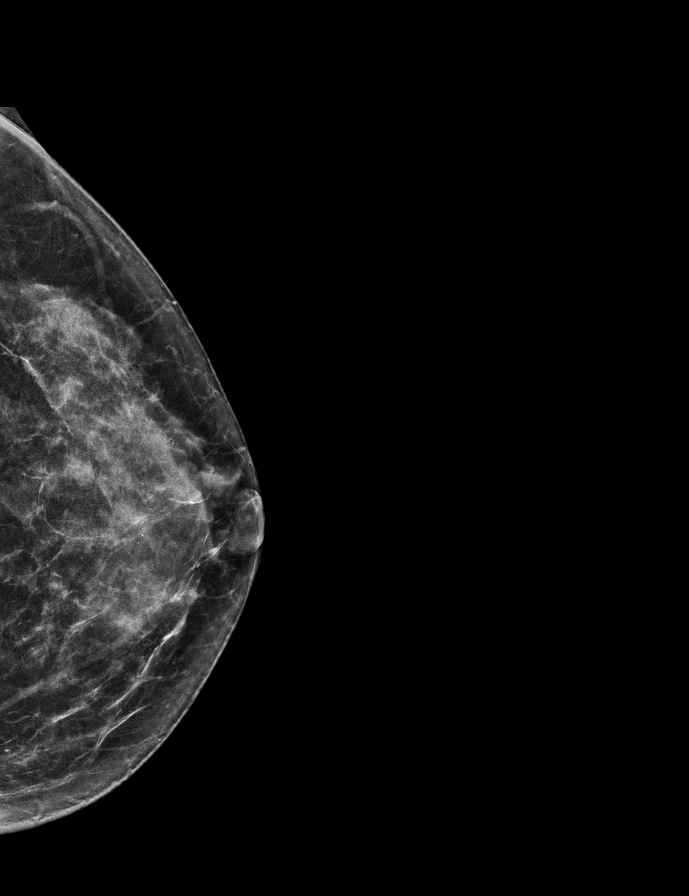

[R CC]
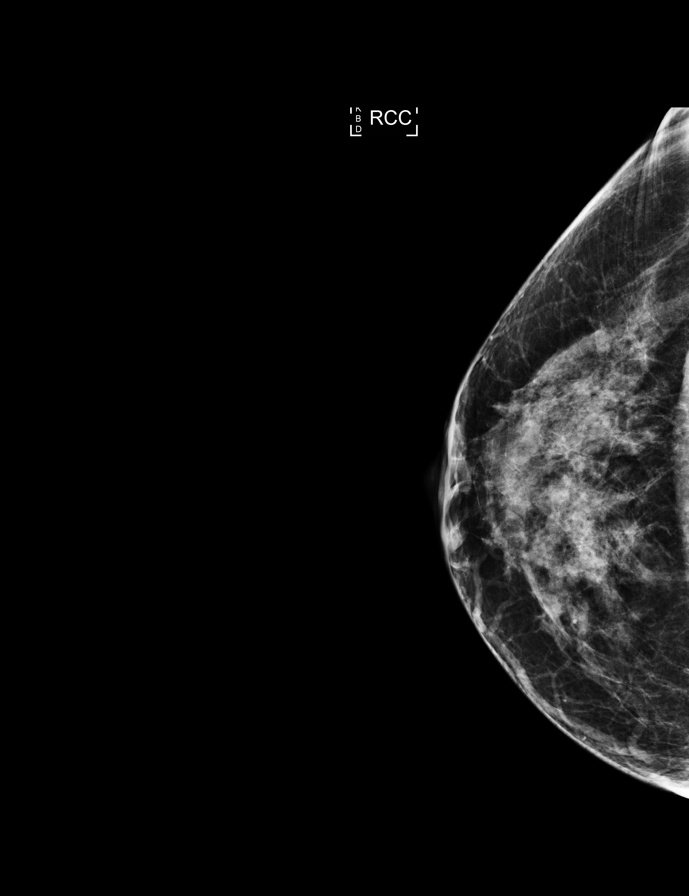

[R CC synth-2D]
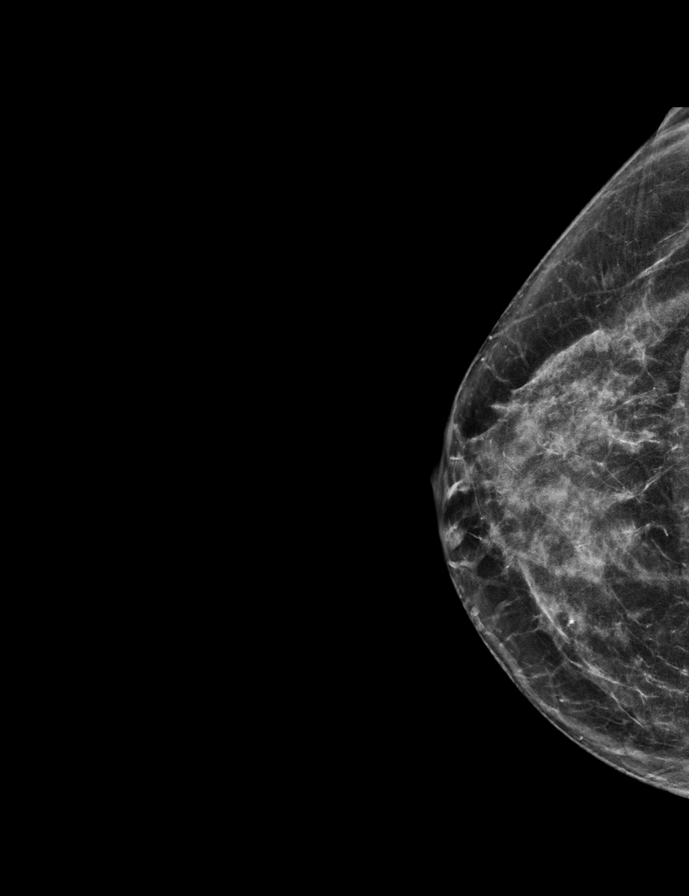

[9 of 29 positions shown; findings below may reference images not displayed]

ACR Breast Density Category c: The breast tissue is heterogeneously
dense, which may obscure small masses.
FINDINGS: Mammographically, there are no suspicious masses, areas of
nonsurgical architectural distortion or microcalcifications in
either breast. Postsurgical changes from excisional biopsy is seen
in the right breast upper outer quadrant.

Mammographic images were processed with CAD.
IMPRESSION: No mammographic evidence of malignancy in either breast, status post
excisional biopsy of the right breast demonstrating high risk
pathology.

RECOMMENDATION:
Diagnostic mammogram is suggested in 1 year. (Code:A9-B-C03)

I have discussed the findings and recommendations with the patient.
Results were also provided in writing at the conclusion of the
visit. If applicable, a reminder letter will be sent to the patient
regarding the next appointment.

BI-RADS CATEGORY  2: Benign.

## 2019-09-28 ENCOUNTER — Other Ambulatory Visit: Payer: Self-pay | Admitting: General Surgery

## 2019-09-28 DIAGNOSIS — N6011 Diffuse cystic mastopathy of right breast: Secondary | ICD-10-CM

## 2019-11-01 ENCOUNTER — Ambulatory Visit
Admission: RE | Admit: 2019-11-01 | Discharge: 2019-11-01 | Disposition: A | Discharge status: Home / Self Care | Payer: BC Managed Care – PPO | Source: Ambulatory Visit | Attending: General Surgery | Admitting: General Surgery

## 2019-11-01 DIAGNOSIS — N6012 Diffuse cystic mastopathy of left breast: Secondary | ICD-10-CM | POA: Diagnosis not present

## 2019-11-01 DIAGNOSIS — Z1231 Encounter for screening mammogram for malignant neoplasm of breast: Secondary | ICD-10-CM | POA: Insufficient documentation

## 2019-11-01 DIAGNOSIS — N6011 Diffuse cystic mastopathy of right breast: Secondary | ICD-10-CM | POA: Insufficient documentation

## 2019-11-02 ENCOUNTER — Other Ambulatory Visit: Payer: Self-pay | Admitting: General Surgery

## 2019-11-02 DIAGNOSIS — N6320 Unspecified lump in the left breast, unspecified quadrant: Secondary | ICD-10-CM

## 2019-11-02 DIAGNOSIS — R928 Other abnormal and inconclusive findings on diagnostic imaging of breast: Secondary | ICD-10-CM

## 2019-11-04 ENCOUNTER — Telehealth: Payer: Self-pay | Admitting: *Deleted

## 2019-11-04 NOTE — Telephone Encounter (Signed)
VM TO PT ON MOBILE # TO [facility] AV - NO ANSWER NO VM ON HOME #

## 2019-11-09 ENCOUNTER — Ambulatory Visit
Admission: RE | Admit: 2019-11-09 | Discharge: 2019-11-09 | Disposition: A | Discharge status: Home / Self Care | Payer: BC Managed Care – PPO | Source: Ambulatory Visit | Attending: General Surgery | Admitting: General Surgery

## 2019-11-09 DIAGNOSIS — R928 Other abnormal and inconclusive findings on diagnostic imaging of breast: Secondary | ICD-10-CM | POA: Diagnosis not present

## 2019-11-09 DIAGNOSIS — N6320 Unspecified lump in the left breast, unspecified quadrant: Secondary | ICD-10-CM

## 2020-10-06 ENCOUNTER — Other Ambulatory Visit: Payer: Self-pay | Admitting: General Surgery

## 2020-10-06 DIAGNOSIS — N6011 Diffuse cystic mastopathy of right breast: Secondary | ICD-10-CM

## 2020-11-01 ENCOUNTER — Other Ambulatory Visit: Payer: Self-pay

## 2020-11-01 ENCOUNTER — Ambulatory Visit
Admission: RE | Admit: 2020-11-01 | Discharge: 2020-11-01 | Disposition: A | Discharge status: Home / Self Care | Payer: BC Managed Care – PPO | Source: Ambulatory Visit | Attending: General Surgery | Admitting: General Surgery

## 2020-11-01 DIAGNOSIS — N6012 Diffuse cystic mastopathy of left breast: Secondary | ICD-10-CM | POA: Insufficient documentation

## 2020-11-01 DIAGNOSIS — Z1231 Encounter for screening mammogram for malignant neoplasm of breast: Secondary | ICD-10-CM | POA: Diagnosis not present

## 2020-11-01 DIAGNOSIS — N6011 Diffuse cystic mastopathy of right breast: Secondary | ICD-10-CM | POA: Diagnosis not present

## 2020-11-03 ENCOUNTER — Other Ambulatory Visit: Payer: Self-pay | Admitting: General Surgery

## 2020-11-03 DIAGNOSIS — N6489 Other specified disorders of breast: Secondary | ICD-10-CM

## 2020-11-03 DIAGNOSIS — R928 Other abnormal and inconclusive findings on diagnostic imaging of breast: Secondary | ICD-10-CM

## 2020-11-10 ENCOUNTER — Other Ambulatory Visit: Payer: Self-pay

## 2020-11-10 ENCOUNTER — Ambulatory Visit
Admission: RE | Admit: 2020-11-10 | Discharge: 2020-11-10 | Disposition: A | Discharge status: Home / Self Care | Payer: BC Managed Care – PPO | Source: Ambulatory Visit | Attending: General Surgery | Admitting: General Surgery

## 2020-11-10 DIAGNOSIS — R928 Other abnormal and inconclusive findings on diagnostic imaging of breast: Secondary | ICD-10-CM | POA: Insufficient documentation

## 2020-11-10 DIAGNOSIS — N6489 Other specified disorders of breast: Secondary | ICD-10-CM | POA: Diagnosis present

## 2021-07-06 ENCOUNTER — Other Ambulatory Visit: Payer: Self-pay

## 2021-07-06 ENCOUNTER — Ambulatory Visit
Admission: RE | Admit: 2021-07-06 | Discharge: 2021-07-06 | Disposition: A | Discharge status: Home / Self Care | Payer: BC Managed Care – PPO | Source: Ambulatory Visit | Attending: Internal Medicine | Admitting: Internal Medicine

## 2021-07-06 VITALS — BP 141/87 | HR 104 | Temp 98.8°F | Resp 18 | Ht 62.0 in | Wt 156.0 lb

## 2021-07-06 DIAGNOSIS — J309 Allergic rhinitis, unspecified: Secondary | ICD-10-CM | POA: Diagnosis not present

## 2021-07-06 DIAGNOSIS — R519 Headache, unspecified: Secondary | ICD-10-CM | POA: Diagnosis not present

## 2021-07-06 MED ORDER — METHYLPREDNISOLONE 4 MG PO TBPK: ORAL_TABLET | ORAL | 0 refills | Status: AC

## 2021-07-06 MED ORDER — FEXOFENADINE HCL 180 MG PO TABS: 180.0000 mg | ORAL_TABLET | Freq: Every day | ORAL | 0 refills | Status: AC

## 2021-07-06 NOTE — ED Triage Notes (Signed)
Patient is here for "possible sinus infection". A lot of sinus congestion/pressure "even teeth hurt". Sinus rinses and OTC not helping. No fever. Symptoms began "last Friday".

## 2021-07-06 NOTE — ED Provider Notes (Signed)
[facility]    CSN: [id number] Arrival date & time: 07/06/21  1415      History   Chief Complaint Chief Complaint  Patient presents with   Nasal Congestion    Appt: 3 pm.    HPI Madison Adams is a 51 y.o. female who presents with onset of vertico one week ago and 2 days later developed nose congestion, sneezing and sinus pressure x 5 days. Denies a fever. Has been taking Tylenol Sinus, flonase, and has done neil med rinses and gets clear mucous, then gets stuffy again. Nose mucous has been clear.     Past Medical History:  Diagnosis Date   Anemia    WITH PREGNANCY ONLY   Complication of anesthesia 2015   HARD TIME WAKING UP FOR 1ST BREAST SURGERY   Family history of adverse reaction to anesthesia    MOM-N/V   Heart murmur    AS A CHILD. no one follows it   Hematuria, microscopic    Ovarian cyst    Pyelonephritis     Patient Active Problem List   Diagnosis Date Noted   S/P laparoscopic hysterectomy 99/24/2683   Umbilical hernia without obstruction and without gangrene 09/27/2016   Left breast mass 08/18/2015   Bilateral renal masses 12/24/2014   Smoker 12/24/2014   Microscopic hematuria 11/14/2014   Recurrent UTI 11/14/2014   Atypical ductal hyperplasia of right breast 12/15/2013   Diffuse cystic mastopathy of both breasts 09/09/2013    Past Surgical History:  Procedure Laterality Date   BREAST BIOPSY Right 10-28-13   FOCUS OF ATYPICAL DUCTAL HYPERPLASIA, 1 MM.   BREAST SURGERY Right 01-07-14   1 mm foci of atypical ductal hyperplasia, Gail model risk 3.8%, 5 years; 34.5% lifetime.   CYSTOSCOPY N/A 12/11/2017   Procedure: CYSTOSCOPY;  Surgeon: Malachy Mood, MD;  Location: [facility];  Service: Gynecology;  Laterality: N/A;   HYSTEROSCOPY WITH D & C N/A 05/02/2017   Procedure: DILATATION AND CURETTAGE /HYSTEROSCOPY;  Surgeon: Malachy Mood, MD;  Location: [facility];  Service: Gynecology;  Laterality: N/A;   LAPAROSCOPIC HYSTERECTOMY Bilateral  12/11/2017   Procedure: HYSTERECTOMY TOTAL LAPAROSCOPIC, BILATERAL SALPINGECTOMY;  Surgeon: Malachy Mood, MD;  Location: [facility];  Service: Gynecology;  Laterality: Bilateral;   TUBAL LIGATION  09/12/60   UMBILICAL HERNIA REPAIR N/A 10/30/2016   Procedure: HERNIA REPAIR UMBILICAL ADULT;  Surgeon: Robert Bellow, MD;  Location: [facility];  Service: General;  Laterality: N/A;   VENTRAL HERNIA REPAIR N/A 11/23/2018   6.4 cm preperitoneal Ventralex mesh;  Surgeon: Robert Bellow, MD;  Location: [facility];  Service: General;  Laterality: N/A;   WISDOM TOOTH EXTRACTION      OB History     Gravida  3   Para  3   Term  3   Preterm      AB      Living  3      SAB      IAB      Ectopic      Multiple      Live Births  3        Obstetric Comments  1st Menstrual Cycle: 14  1st Pregnancy:  19          Home Medications    Prior to Admission medications   Medication Sig Start Date End Date Taking? Authorizing Provider  fexofenadine (ALLEGRA ALLERGY) 180 MG tablet Take 1 tablet (180 mg total) by mouth daily. 07/06/21  Yes Rodriguez-Southworth, Sunday Spillers, PA-C  methylPREDNISolone (MEDROL DOSEPAK) 4 MG TBPK tablet Take as directed 07/06/21  Yes Rodriguez-Southworth, Sunday Spillers, PA-C  Multiple Vitamin (MULTIVITAMIN WITH MINERALS) TABS tablet Take 1 tablet by mouth daily. Women's Multivitamin   Yes [provider]  raloxifene (EVISTA) 60 MG tablet Take 60 mg by mouth every evening. 11/08/18  Yes [provider]    Family History Family History  Problem Relation Age of Onset   Breast cancer Mother 83   Healthy Father    Heart failure Paternal Grandfather 74    Social History Social History   Tobacco Use   Smoking status: Every Day    Packs/day: 0.50    Years: 11.00    Pack years: 5.50    Types: Cigarettes    Start date: 01/16/1997   Smokeless tobacco: Never  Vaping Use   Vaping Use: Never used  Substance Use Topics   Alcohol use: No     Alcohol/week: 0.0 standard drinks   Drug use: No     Allergies   Penicillins   Review of Systems Review of Systems  Constitutional:  Positive for fatigue. Negative for activity change, appetite change, chills, diaphoresis and fever.  HENT:  Positive for congestion, rhinorrhea, sinus pressure and sinus pain. Negative for sore throat and trouble swallowing.   Eyes:  Positive for itching. Negative for discharge.  Respiratory:  Positive for cough.        From post nasal drainage  Musculoskeletal:  Negative for myalgias.  Neurological:  Negative for headaches.  Hematological:  Negative for adenopathy.  Psychiatric/Behavioral:  Positive for sleep disturbance.     Physical Exam Triage Vital Signs ED Triage Vitals  Enc Vitals Group     BP 07/06/21 1437 (!) 141/87     Pulse Rate 07/06/21 1437 (!) 104     Resp 07/06/21 1437 18     Temp 07/06/21 1437 98.8 F (37.1 C)     Temp Source 07/06/21 1437 Oral     SpO2 07/06/21 1437 99 %     Weight 07/06/21 1437 156 lb (70.8 kg)     Height 07/06/21 1437 5\' 2"  (1.575 m)     Head Circumference --      Peak Flow --      Pain Score 07/06/21 1436 9     Pain Loc --      Pain Edu? --      Excl. in [place]? --    No data found.  Updated Vital Signs BP (!) 141/87 (BP Location: Left Arm)    Pulse (!) 104    Temp 98.8 F (37.1 C) (Oral)    Resp 18    Ht 5\' 2"  (1.575 m)    Wt 156 lb (70.8 kg)    LMP 12/07/2017 (Exact Date)    SpO2 99%    BMI 28.53 kg/m   Visual Acuity Right Eye Distance:   Left Eye Distance:   Bilateral Distance:    Right Eye Near:   Left Eye Near:    Bilateral Near:     Physical Exam Alert pt NAD who seems nasally congested EYES- non icterus, mild watering, no purulent drainage NOSE- moderate mucosa congestion which is pale pink with clear mucous. Has sinus tenderness all over. Has normal light translumination of sinuses.  TM- both gray and little dull, canals are normal PHARYNX- clear, clear drainage noted.  NECK- supple  with no nodes LUNGS- clear HEART - RRR with no murmurs SKIN- non jaundiced, no rashes.    UC Treatments /  Results  Labs (all labs ordered are listed, but only abnormal results are displayed) Labs Reviewed - No data to display  EKG   Radiology No results found.  Procedures Procedures (including critical care time)  Medications Ordered in UC Medications - No data to display  Initial Impression / Assessment and Plan / UC Course  I have reviewed the triage vital signs and the nursing notes. Allergic rhinitis and sinus HA I placed her on Allegra and Medrol dose pack as noted.      Final Clinical Impressions(s) / UC Diagnoses   Final diagnoses:  Allergic rhinitis, unspecified seasonality, unspecified trigger  Sinus headache   Discharge Instructions   None    ED Prescriptions     Medication Sig Dispense Auth. Provider   methylPREDNISolone (MEDROL DOSEPAK) 4 MG TBPK tablet Take as directed 21 tablet Rodriguez-Southworth, Sunday Spillers, PA-C   fexofenadine (ALLEGRA ALLERGY) 180 MG tablet Take 1 tablet (180 mg total) by mouth daily. 30 tablet Rodriguez-Southworth, Sunday Spillers, PA-C      PDMP not reviewed this encounter.   Shelby Mattocks, [place] 07/06/21 1806

## 2021-10-24 ENCOUNTER — Other Ambulatory Visit: Payer: Self-pay | Admitting: General Surgery

## 2021-10-24 DIAGNOSIS — Z1231 Encounter for screening mammogram for malignant neoplasm of breast: Secondary | ICD-10-CM

## 2021-11-19 ENCOUNTER — Ambulatory Visit
Admission: RE | Admit: 2021-11-19 | Discharge: 2021-11-19 | Disposition: A | Discharge status: Home / Self Care | Payer: BC Managed Care – PPO | Source: Ambulatory Visit | Attending: General Surgery | Admitting: General Surgery

## 2021-11-19 DIAGNOSIS — Z1231 Encounter for screening mammogram for malignant neoplasm of breast: Secondary | ICD-10-CM | POA: Insufficient documentation

## 2021-11-30 ENCOUNTER — Other Ambulatory Visit: Payer: Self-pay | Admitting: General Surgery

## 2021-11-30 NOTE — Progress Notes (Signed)
Progress Notes - documented in this encounter Madison Adams, [name], MD - 11/29/2021 4:00 PM EDT Formatting of this note is different from the original.   Subjective:   Patient ID: Madison Adams is a 51 y.o. female.  HPI  The following portions of the patient's history were reviewed and updated as appropriate.  This an established patient is here today for: office visit. The patient is here today to follow up from a recent mammogram. She denies any new breast concerns. Patient had a right lumpectomy in 2015 for atypical ductal hyperplasia.   The patient also needs to discuss a colonoscopy. Patient denies any family history of colon polyps or colon cancer. Patient reports bowel movements one to three times per day. She denies any bleeding or mucus. Patient does have a history of hemorrhoids.   Chief Complaint  Patient presents with  Follow-up    BP 118/79  Pulse 68  Temp 36.7 C (98 F)  Ht 157.5 cm ('5\' 2"'$ )  Wt 74.4 kg (164 lb)  LMP 07/13/2015 (Exact Date)  SpO2 98%  BMI 30.00 kg/m   Past Medical History:  Diagnosis Date  Allergic rhinitis  Anemia  Breast lump  Cardiac murmur  Complication of anesthesia 2015  Family history of adverse reaction to anesthesia  Hematuria, microscopic  Ovarian cyst  Pyelonephritis    Past Surgical History:  Procedure Laterality Date  TUBAL LIGATION 01/14/1996  BREAST EXCISIONAL BIOPSY Right 10/28/2013  MASTECTOMY PARTIAL / LUMPECTOMY Right 01/07/2014  Small foci of atypical ductal hyperplasia. Managed with Evista 60 mg daily since that time with good results.  UMBILICAL HERNIA REPAIR 30/86/5784  HYSTEROSCOPY 05/02/2017  with D&C  LAPAROSCOPIC HYSTERECTOMY TOTAL Bilateral 12/11/2017  Dr. Georgianne Fick  CYSTOSCOPY 12/11/2017  REPAIR INCISIONAL/VENTRAL HERNIA 11/23/2018  wisdom tooth extraction    OB History   Gravida  3  Para  3  Term   Preterm   AB   Living     SAB   IAB   Ectopic   Molar   Multiple    Live Births     Obstetric Comments  Age at first period 36 Age of first pregnancy 85 Hysterectomy age 66      Social History   Socioeconomic History  Marital status: Single  Tobacco Use  Smoking status: Former  Packs/day: 0.50  Years: 11.00  Pack years: 5.50  Types: Cigarettes  Quit date: 11/08/2021  Years since quitting: 0.0  Smokeless tobacco: Never  Substance and Sexual Activity  Alcohol use: No  Alcohol/week: 0.0 standard drinks  Drug use: Never    Allergies  Allergen Reactions  Penicillins Anaphylaxis   Current Outpatient Medications  Medication Sig Dispense Refill  hydrocodone-chlorpheniramine (TUSSIONEX) 10-8 mg/5 mL ER suspension Take 5 mLs by mouth every 12 (twelve) hours as needed for Cough. (Patient not taking: Reported on 07/31/2015) 120 mL 0  polyethylene glycol (MIRALAX) powder One bottle for colonoscopy prep. Use as directed. 255 g 0  predniSONE (DELTASONE) 5 MG tablet 6-5-4-3-2-1-off (Patient not taking: Reported on 07/31/2015) 21 tablet 0   No current facility-administered medications for this visit.   Family History  Problem Relation Age of Onset  Breast cancer Mother 15  Heart failure Paternal Grandfather  Colon cancer Neg Hx   Labs and Radiology:   November 19, 2021 mammography review:  No interval change, BI-RADS-1. These films were independently reviewed.  Review of Systems  Constitutional: Negative for chills and fever.  Respiratory: Negative for cough.    Objective:  Physical Exam Exam  conducted with a chaperone present.  Constitutional:  Appearance: Normal appearance.  Cardiovascular:  Rate and Rhythm: Normal rate and regular rhythm.  Pulses: Normal pulses.  Heart sounds: Normal heart sounds.  Pulmonary:  Effort: Pulmonary effort is normal.  Breath sounds: Normal breath sounds.  Chest:  Breasts: Right: Normal.  Left: Normal.  Musculoskeletal:  Cervical back: Neck supple.  Lymphadenopathy:  Upper Body:  Right upper  body: No supraclavicular or axillary adenopathy.  Left upper body: No supraclavicular or axillary adenopathy.  Skin: General: Skin is warm and dry.  Neurological:  Mental Status: She is alert and oriented to person, place, and time.  Psychiatric:  Mood and Affect: Mood normal.  Behavior: Behavior normal.    Assessment:   No evidence of recurrent abnormality of the breast, 8 years post biopsy for ADH.  Completion of chemoprevention trial with 5+ years of raloxifene.  Candidate for ongoing general medical evaluation/GYN. Her prior provider has moved out of the county.  Candidate for colon cancer screening.  Plan:   Options for colon cancer screening were reviewed: 1) Cologuard versus 2) colonoscopy. Risks, benefits and limitations of each were reviewed. Patient desires to proceed to colonoscopy.  She had been followed by Margret Chance, MD at [place] in the past. He has left the area. She was amenable and desirous of meeting with a new GYN locally. A referral has been sent to Prentice Docker, MD here.   This note is partially prepared by Ledell Noss, CMA acting as a scribe in the presence of Dr. Hervey Ard, MD.   The documentation recorded by the scribe accurately reflects the service I personally performed and the decisions made by me.   Robert Bellow, MD FACS

## 2021-12-18 ENCOUNTER — Encounter: Payer: Self-pay | Admitting: General Surgery

## 2021-12-19 ENCOUNTER — Ambulatory Visit
Admission: RE | Admit: 2021-12-19 | Discharge: 2021-12-19 | Disposition: A | Discharge status: Home / Self Care | Payer: BC Managed Care – PPO | Source: Ambulatory Visit | Attending: General Surgery | Admitting: General Surgery

## 2021-12-19 ENCOUNTER — Ambulatory Visit: Payer: BC Managed Care – PPO | Admitting: Anesthesiology

## 2021-12-19 ENCOUNTER — Encounter
Admission: RE | Disposition: A | Discharge status: Home / Self Care | Payer: Self-pay | Source: Ambulatory Visit | Attending: General Surgery

## 2021-12-19 ENCOUNTER — Encounter: Payer: Self-pay | Admitting: General Surgery

## 2021-12-19 DIAGNOSIS — Z1211 Encounter for screening for malignant neoplasm of colon: Secondary | ICD-10-CM | POA: Diagnosis present

## 2021-12-19 DIAGNOSIS — Z87891 Personal history of nicotine dependence: Secondary | ICD-10-CM | POA: Diagnosis not present

## 2021-12-19 HISTORY — PX: COLONOSCOPY WITH PROPOFOL: SHX5780

## 2021-12-19 SURGERY — COLONOSCOPY WITH PROPOFOL
Anesthesia: General

## 2021-12-19 MED ORDER — PROPOFOL 500 MG/50ML IV EMUL
INTRAVENOUS | Status: DC | PRN
  Administered 2021-12-19: 150 ug/kg/min via INTRAVENOUS

## 2021-12-19 MED ORDER — PROPOFOL 10 MG/ML IV BOLUS
INTRAVENOUS | Status: DC | PRN
  Administered 2021-12-19: 100 mg via INTRAVENOUS

## 2021-12-19 MED ORDER — SODIUM CHLORIDE 0.9 % IV SOLN: INTRAVENOUS | Status: DC

## 2021-12-19 MED ORDER — SODIUM CHLORIDE 0.9 % IV SOLN: INTRAVENOUS | Status: DC | PRN

## 2021-12-19 MED ORDER — MIDAZOLAM HCL 2 MG/2ML IJ SOLN
INTRAMUSCULAR | Status: AC
  Filled 2021-12-19: qty 2

## 2021-12-19 MED ORDER — PROPOFOL 1000 MG/100ML IV EMUL
INTRAVENOUS | Status: AC
  Filled 2021-12-19: qty 100

## 2021-12-19 MED ORDER — MIDAZOLAM HCL 2 MG/2ML IJ SOLN
INTRAMUSCULAR | Status: DC | PRN
  Administered 2021-12-19: 2 mg via INTRAVENOUS

## 2021-12-19 NOTE — H&P (Signed)
Madison Adams [id number] 05-Jan-1971     HPI: Healthy 51 y/o woman for screening colonoscopy. Tolerated the prep well.   Medications Prior to Admission  Medication Sig Dispense Refill Last Dose   fexofenadine (ALLEGRA ALLERGY) 180 MG tablet Take 1 tablet (180 mg total) by mouth daily. 30 tablet 0    methylPREDNISolone (MEDROL DOSEPAK) 4 MG TBPK tablet Take as directed 21 tablet 0    Multiple Vitamin (MULTIVITAMIN WITH MINERALS) TABS tablet Take 1 tablet by mouth daily. Women's Multivitamin      raloxifene (EVISTA) 60 MG tablet Take 60 mg by mouth every evening. (Patient not taking: Reported on 12/19/2021)   Not Taking   Allergies  Allergen Reactions   Penicillins Anaphylaxis, Hives, Shortness Of Breath, Swelling and Other (See Comments)    Has patient had a PCN reaction causing immediate rash, facial/tongue/throat swelling, SOB or lightheadedness with hypotension: Yes Has patient had a PCN reaction causing severe rash involving mucus membranes or skin necrosis: Yes Has patient had a PCN reaction that required hospitalization:Treated in ER Has patient had a PCN reaction occurring within the last 10 years: No If all of the above answers are "NO", then may proceed with Cephalosporin use. Childhood allergy   Past Medical History:  Diagnosis Date   Anemia    WITH PREGNANCY ONLY   Complication of anesthesia 2015   HARD TIME WAKING UP FOR 1ST BREAST SURGERY   Family history of adverse reaction to anesthesia    MOM-N/V   Heart murmur    AS A CHILD. no one follows it   Hematuria, microscopic    Ovarian cyst    Pyelonephritis    Past Surgical History:  Procedure Laterality Date   BREAST BIOPSY Right 10/28/2013   FOCUS OF ATYPICAL DUCTAL HYPERPLASIA, 1 MM.   BREAST SURGERY Right 01/07/2014   1 mm foci of atypical ductal hyperplasia, Baker Janus model risk 3.8%, 5 years; 34.5% lifetime.   CYSTOSCOPY N/A 12/11/2017   Procedure: CYSTOSCOPY;  Surgeon: Malachy Mood, MD;  Location: [facility];  Service: Gynecology;  Laterality: N/A;   HERNIA REPAIR     HYSTEROSCOPY WITH D & C N/A 05/02/2017   Procedure: DILATATION AND CURETTAGE /HYSTEROSCOPY;  Surgeon: Malachy Mood, MD;  Location: [facility];  Service: Gynecology;  Laterality: N/A;   LAPAROSCOPIC HYSTERECTOMY Bilateral 12/11/2017   Procedure: HYSTERECTOMY TOTAL LAPAROSCOPIC, BILATERAL SALPINGECTOMY;  Surgeon: Malachy Mood, MD;  Location: [facility];  Service: Gynecology;  Laterality: Bilateral;   TUBAL LIGATION  95/28/4132   UMBILICAL HERNIA REPAIR N/A 10/30/2016   Procedure: HERNIA REPAIR UMBILICAL ADULT;  Surgeon: Robert Bellow, MD;  Location: [facility];  Service: General;  Laterality: N/A;   VENTRAL HERNIA REPAIR N/A 11/23/2018   6.4 cm preperitoneal Ventralex mesh;  Surgeon: Robert Bellow, MD;  Location: [facility];  Service: General;  Laterality: N/A;   WISDOM TOOTH EXTRACTION     Social History   Socioeconomic History   Marital status: Single    Spouse name: Not on file   Number of children: Not on file   Years of education: Not on file   Highest education level: Not on file  Occupational History   Not on file  Tobacco Use   Smoking status: Former    Packs/day: 0.50    Years: 11.00    Total pack years: 5.50    Types: Cigarettes    Start date: 01/16/1997    Quit date: 11/08/2021    Years since quitting: 0.1   Smokeless  tobacco: Never  Vaping Use   Vaping Use: Never used  Substance and Sexual Activity   Alcohol use: No    Alcohol/week: 0.0 standard drinks of alcohol   Drug use: No   Sexual activity: Yes    Birth control/protection: None  Other Topics Concern   Not on file  Social History Narrative   Not on file   Social Determinants of Health   Financial Resource Strain: Not on file  Food Insecurity: Not on file  Transportation Needs: Not on file  Physical Activity: Not on file  Stress: Not on file  Social Connections: Not on file  Intimate Partner Violence: Not on file   Social  History   Social History Narrative   Not on file     ROS: Negative.     PE: HEENT: Negative. Lungs: Clear. Cardio: RR.   Assessment/Plan:  Proceed with planned endoscopy.  Forest Gleason [facility] 12/19/2021

## 2021-12-19 NOTE — Op Note (Signed)
[facility] Gastroenterology Patient Name: Madison Adams Procedure Date: 12/19/2021 9:25 AM MRN: [id number] Account #: [id number] Date of Birth: 1970/07/01 Admit Type: Outpatient Age: 51 Room: [facility] ENDO ROOM 1 Gender: Female Note Status: Finalized Instrument Name: Peds Colonoscope [id number] Procedure:             Colonoscopy Indications:           Screening for colorectal malignant neoplasm Providers:             Robert Bellow, MD Referring MD:          Stoney Bang. Staebler (Referring MD) Medicines:             Propofol per Anesthesia Complications:         No immediate complications. Procedure:             Pre-Anesthesia Assessment:                        - Prior to the procedure, a History and Physical was                         performed, and patient medications, allergies and                         sensitivities were reviewed. The patient's tolerance                         of previous anesthesia was reviewed.                        - The risks and benefits of the procedure and the                         sedation options and risks were discussed with the                         patient. All questions were answered and informed                         consent was obtained.                        After obtaining informed consent, the colonoscope was                         passed under direct vision. Throughout the procedure,                         the patient's blood pressure, pulse, and oxygen                         saturations were monitored continuously. The                         Colonoscope was introduced through the anus and                         advanced to the the cecum, identified by appendiceal  orifice and ileocecal valve. The colonoscopy was                         performed without difficulty. The patient tolerated                         the procedure well. The quality of the bowel                          preparation was excellent. Findings:      The entire examined colon appeared normal on direct and retroflexion       views. Impression:            - The entire examined colon is normal on direct and                         retroflexion views.                        - No specimens collected. Recommendation:        - Repeat colonoscopy in 10 years for screening                         purposes. Procedure Code(s):     --- Professional ---                        [phone], Colonoscopy, flexible; diagnostic, including                         collection of specimen(s) by brushing or washing, when                         performed (separate procedure) Diagnosis Code(s):     --- Professional ---                        Z12.11, Encounter for screening for malignant neoplasm                         of colon CPT copyright 2019 [employer]. All rights reserved. The codes documented in this report are preliminary and upon coder review may  be revised to meet current compliance requirements. Robert Bellow, MD 12/19/2021 9:59:42 AM This report has been signed electronically. Number of Addenda: 0 Note Initiated On: 12/19/2021 9:25 AM Scope Withdrawal Time: 0 hours 12 minutes 3 seconds  Total Procedure Duration: 0 hours 16 minutes 45 seconds  Estimated Blood Loss:  Estimated blood loss: none.      [facility]

## 2021-12-19 NOTE — Anesthesia Preprocedure Evaluation (Signed)
Anesthesia Evaluation  Patient identified by MRN, date of birth, ID band Patient awake    Reviewed: Allergy & Precautions, NPO status , Patient's Chart, lab work & pertinent test results  Airway Mallampati: II  TM Distance: >3 FB Neck ROM: Full    Dental  (+) Teeth Intact   Pulmonary neg pulmonary ROS, Patient abstained from smoking., former smoker,    Pulmonary exam normal breath sounds clear to auscultation       Cardiovascular Exercise Tolerance: Good negative cardio ROS Normal cardiovascular exam Rhythm:Regular     Neuro/Psych negative neurological ROS  negative psych ROS   GI/Hepatic negative GI ROS, Neg liver ROS,   Endo/Other  negative endocrine ROS  Renal/GU negative Renal ROS  negative genitourinary   Musculoskeletal negative musculoskeletal ROS (+)   Abdominal Normal abdominal exam  (+)   Peds negative pediatric ROS (+)  Hematology negative hematology ROS (+) Blood dyscrasia, anemia ,   Anesthesia Other Findings Past Medical History: No date: Anemia     Comment:  WITH PREGNANCY ONLY [id number]: Complication of anesthesia     Comment:  HARD TIME WAKING UP FOR 1ST BREAST SURGERY No date: Family history of adverse reaction to anesthesia     Comment:  MOM-N/V No date: Heart murmur     Comment:  AS A CHILD. no one follows it No date: Hematuria, microscopic No date: Ovarian cyst No date: Pyelonephritis  Past Surgical History: 10/28/2013: BREAST BIOPSY; Right     Comment:  FOCUS OF ATYPICAL DUCTAL HYPERPLASIA, 1 MM. 01/07/2014: BREAST SURGERY; Right     Comment:  1 mm foci of atypical ductal hyperplasia, Gail model               risk 3.8%, 5 years; 34.5% lifetime. 12/11/2017: CYSTOSCOPY; N/A     Comment:  Procedure: CYSTOSCOPY;  Surgeon: Malachy Mood, MD;               Location: [facility] ORS;  Service: Gynecology;  Laterality:               N/A; No date: HERNIA REPAIR 05/02/2017: HYSTEROSCOPY WITH D &  C; N/A     Comment:  Procedure: DILATATION AND CURETTAGE /HYSTEROSCOPY;                Surgeon: Malachy Mood, MD;  Location: [facility] ORS;                Service: Gynecology;  Laterality: N/A; 12/11/2017: LAPAROSCOPIC HYSTERECTOMY; Bilateral     Comment:  Procedure: HYSTERECTOMY TOTAL LAPAROSCOPIC, BILATERAL               SALPINGECTOMY;  Surgeon: Malachy Mood, MD;                Location: [facility] ORS;  Service: Gynecology;  Laterality:               Bilateral; 01/14/1996: TUBAL LIGATION 97/67/3419: UMBILICAL HERNIA REPAIR; N/A     Comment:  Procedure: HERNIA REPAIR UMBILICAL ADULT;  Surgeon:               Robert Bellow, MD;  Location: [facility] ORS;  Service:               General;  Laterality: N/A; 11/23/2018: VENTRAL HERNIA REPAIR; N/A     Comment:  6.4 cm preperitoneal Ventralex mesh;  Surgeon: Robert Bellow, MD;  Location: [facility] ORS;  Service: General;  Laterality: N/A; No date: WISDOM TOOTH EXTRACTION  BMI    Body Mass Index: 30.00 kg/m      Reproductive/Obstetrics negative OB ROS                             Anesthesia Physical Anesthesia Plan  ASA: 2  Anesthesia Plan: General   Post-op Pain Management:    Induction: Intravenous  PONV Risk Score and Plan: Propofol infusion and TIVA  Airway Management Planned: Natural Airway  Additional Equipment:   Intra-op Plan:   Post-operative Plan:   Informed Consent: I have reviewed the patients History and Physical, chart, labs and discussed the procedure including the risks, benefits and alternatives for the proposed anesthesia with the patient or authorized representative who has indicated his/her understanding and acceptance.     Dental Advisory Given  Plan Discussed with: CRNA and Surgeon  Anesthesia Plan Comments:         Anesthesia Quick Evaluation

## 2021-12-19 NOTE — Anesthesia Postprocedure Evaluation (Signed)
Anesthesia Post Note  Patient: Madison Adams  Procedure(s) Performed: COLONOSCOPY WITH PROPOFOL  Patient location during evaluation: PACU Anesthesia Type: General Level of consciousness: awake and oriented Pain management: satisfactory to patient Vital Signs Assessment: post-procedure vital signs reviewed and stable Respiratory status: spontaneous breathing and respiratory function stable Cardiovascular status: stable Anesthetic complications: no   No notable events documented.   Last Vitals:  Vitals:   12/19/21 1012 12/19/21 1022  BP: (!) 145/88 (!) 155/92  Pulse: 67 65  Resp: 17 17  Temp:    SpO2: 100% 98%    Last Pain:  Vitals:   12/19/21 1022  TempSrc:   PainSc: 0-No pain                 VAN STAVEREN,Shemuel Harkleroad

## 2021-12-19 NOTE — Transfer of Care (Signed)
Immediate Anesthesia Transfer of Care Note  Patient: Madison Adams  Procedure(s) Performed: COLONOSCOPY WITH PROPOFOL  Patient Location: PACU and Endoscopy Unit  Anesthesia Type:MAC  Level of Consciousness: drowsy  Airway & Oxygen Therapy: Patient Spontanous Breathing and Patient connected to nasal cannula oxygen  Post-op Assessment: Report given to RN and Post -op Vital signs reviewed and stable  Post vital signs: Reviewed and stable  Last Vitals:  Vitals Value Taken Time  BP 126/75 12/19/21 1002  Temp 35.6 C 12/19/21 1002  Pulse 59 12/19/21 1006  Resp 19 12/19/21 1006  SpO2 100 % 12/19/21 1006  Vitals shown include unvalidated device data.  Last Pain:  Vitals:   12/19/21 1002  TempSrc: Temporal  PainSc: Asleep         Complications: No notable events documented.

## 2021-12-20 ENCOUNTER — Encounter: Payer: Self-pay | Admitting: General Surgery

## 2022-12-26 ENCOUNTER — Other Ambulatory Visit: Payer: Self-pay | Admitting: Family Medicine

## 2022-12-26 DIAGNOSIS — Z1231 Encounter for screening mammogram for malignant neoplasm of breast: Secondary | ICD-10-CM

## 2023-01-08 ENCOUNTER — Ambulatory Visit
Admission: RE | Admit: 2023-01-08 | Discharge: 2023-01-08 | Disposition: A | Discharge status: Home / Self Care | Payer: BC Managed Care – PPO | Source: Ambulatory Visit | Attending: Family Medicine | Admitting: Family Medicine

## 2023-01-08 DIAGNOSIS — Z1231 Encounter for screening mammogram for malignant neoplasm of breast: Secondary | ICD-10-CM | POA: Insufficient documentation

## 2024-03-02 ENCOUNTER — Ambulatory Visit

## 2024-03-02 DIAGNOSIS — Z23 Encounter for immunization: Secondary | ICD-10-CM | POA: Diagnosis not present
